# Patient Record
Sex: Male | Born: 1940 | Race: White | Hispanic: No | Marital: Married
Health system: Midwestern US, Academic
[De-identification: ages and names within clinical notes are randomized; demographics above are authoritative.]

---

## 2018-08-19 ENCOUNTER — Encounter: Admit: 2018-08-19 | Discharge: 2018-08-19 | Payer: MEDICARE

## 2018-08-22 ENCOUNTER — Encounter: Admit: 2018-08-22 | Discharge: 2018-08-22 | Payer: MEDICARE

## 2018-08-22 ENCOUNTER — Ambulatory Visit: Admit: 2018-08-22 | Discharge: 2018-08-23 | Payer: MEDICARE

## 2018-08-22 DIAGNOSIS — R0602 Shortness of breath: ICD-10-CM

## 2018-08-22 DIAGNOSIS — R634 Abnormal weight loss: ICD-10-CM

## 2018-08-22 DIAGNOSIS — R1310 Dysphagia, unspecified: Principal | ICD-10-CM

## 2018-08-22 DIAGNOSIS — R112 Nausea with vomiting, unspecified: ICD-10-CM

## 2018-08-22 DIAGNOSIS — R079 Chest pain, unspecified: ICD-10-CM

## 2018-08-28 ENCOUNTER — Encounter: Admit: 2018-08-28 | Discharge: 2018-08-28 | Payer: MEDICARE

## 2018-08-28 NOTE — Progress Notes
Alll biopsies were normal. No Hp.      During November 2019 hospitalization, he saw a cardiologist, had CT scan and EKG. CT of chest showed pleural effusion.  Echocardiogram showed ejection fraction of 55%.  Blood tests were unremarkable.  Culture for AFB, and work-up for syphilis were negative.  He has been on hydrocodone 5/325 mg every 4 hours as needed which he takes twice a week.  Colonoscopy in December 2019 reportedly was unremarkable.    Medications: He takes ASA, pepcid 20 mg daily. Pantoprazole 40 mg QD, tagament TID, flomax for BPH, lamictal for hx of seizure 20 years ago.     ROS: he has cough and phlegm clear. No F/C. bowel movement is every other day. He gets SOB if walks. He wakes up in the middle of night with SOB.     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 ETOH one beer a week. Lives with wife. Wife has 3 children and he has 2 children. His 3rd marriage, divorced.     FH: Brother had CAD, COPD and DM. Died at age 56.  F: died at age 100 yo, old age.  M: died at age 7 during childbirth.        No past medical history on file.  No past surgical history on file.  Social History     Tobacco Use   ??? Smoking status: Not on file   Substance Use Topics   ??? Alcohol use: Not on file   ??? Drug use: Not on file     No family history on file.  No Known Allergies  ??? ALPRAZolam (XANAX) 0.5 mg tablet Take 0.5 mg by mouth.   ??? aspirin EC 81 mg tablet Take 81 mg by mouth daily.   ??? calcium carbonate/vitamin D-3 (OSCAL-500+D) 1250 mg/200 unit tablet Take 1 tablet by mouth daily.   ??? famotidine (PEPCID) 20 mg tablet    ??? gabapentin (NEURONTIN) 300 mg capsule Take 300 mg by mouth.   ??? HYDROcodone/acetaminophen (NORCO) 5/325 mg tablet Take 1 tablet by mouth   ??? lamoTRIgine (LAMICTAL) 200 mg tablet Take 200 mg by mouth daily.   ??? ondansetron (ZOFRAN ODT) 4 mg rapid dissolve tablet DISSOLVE 1 TABLET IN MOUTH EVERY 6 HOURS AS NEEDED FOR NAUSEA AND VOMITING   ??? Pantoprazole 40 mg grps

## 2018-09-10 ENCOUNTER — Encounter: Admit: 2018-09-10 | Discharge: 2018-09-10 | Payer: MEDICARE

## 2018-09-10 DIAGNOSIS — R112 Nausea with vomiting, unspecified: ICD-10-CM

## 2018-09-10 DIAGNOSIS — R634 Abnormal weight loss: ICD-10-CM

## 2018-09-10 DIAGNOSIS — Z8701 Personal history of pneumonia (recurrent): ICD-10-CM

## 2018-09-10 DIAGNOSIS — Z8709 Personal history of other diseases of the respiratory system: ICD-10-CM

## 2018-09-10 DIAGNOSIS — R0602 Shortness of breath: ICD-10-CM

## 2018-09-10 DIAGNOSIS — R1310 Dysphagia, unspecified: Principal | ICD-10-CM

## 2018-09-10 DIAGNOSIS — R109 Unspecified abdominal pain: ICD-10-CM

## 2018-09-10 DIAGNOSIS — R079 Chest pain, unspecified: ICD-10-CM

## 2018-09-12 ENCOUNTER — Encounter: Admit: 2018-09-12 | Discharge: 2018-09-12 | Payer: MEDICARE

## 2018-09-12 NOTE — Telephone Encounter
Called. Was not available. and gave the nurse my cell # for Doctor Wyant to call.

## 2018-09-12 NOTE — Telephone Encounter
Reviewed 10 pages of OSH ER visit in 06/2018, CBC, CMP, CXR. A/P: symptomatic treatment for Nausea and pain.  CXR: pleural thickeninga nd blunting.   Blood etts significant for Crt 1.3, normal HB, MCV 95, Eosinophils 9.1%.    Please: CT scan of chest abdomen and pelvis, echocardiogram, swallow evaluation under fluoroscopy and esophagogram with 13 mm barium tablet.   Blood test for CBC, CMP, TSH, ESR, CRP, SPEP, Vitamin D, B12, iron studies, INR

## 2018-09-13 ENCOUNTER — Encounter: Admit: 2018-09-13 | Discharge: 2018-09-13 | Payer: MEDICARE

## 2018-09-13 NOTE — Telephone Encounter
See Mychart message dated 09/10/18 for further documentation.

## 2018-09-14 NOTE — Telephone Encounter
Physically printed all records   Called Dr. Garner Nash office at [phone]. Was informed they received the faxed. They ordered and scheduled 2D Echo, CT of chest/abdomen/pelvis with contrast, Esophagram w/ 13 mm barium tablet and swallow study with fluoroscopy to be done locally. Pt had blood work drawn today. Requested all results to be faxed to [phone]. Mychart message sent to Olegario Messier to update her on conversation as well as VM left for her with same info.

## 2018-09-28 ENCOUNTER — Encounter: Admit: 2018-09-28 | Discharge: 2018-09-28 | Payer: MEDICARE

## 2018-09-29 ENCOUNTER — Encounter: Admit: 2018-09-29 | Discharge: 2018-09-29 | Payer: MEDICARE

## 2018-09-30 ENCOUNTER — Encounter: Admit: 2018-09-30 | Discharge: 2018-09-30 | Payer: MEDICARE

## 2018-09-30 DIAGNOSIS — R634 Abnormal weight loss: ICD-10-CM

## 2018-09-30 DIAGNOSIS — R1310 Dysphagia, unspecified: Principal | ICD-10-CM

## 2018-09-30 DIAGNOSIS — R109 Unspecified abdominal pain: ICD-10-CM

## 2018-09-30 DIAGNOSIS — R112 Nausea with vomiting, unspecified: ICD-10-CM

## 2018-09-30 DIAGNOSIS — R079 Chest pain, unspecified: ICD-10-CM

## 2018-09-30 DIAGNOSIS — R0602 Shortness of breath: ICD-10-CM

## 2018-09-30 LAB — CBC
Lab: 15
Lab: 268
Lab: 33
Lab: 4.8
Lab: 6.3
Lab: 95 — ABNORMAL HIGH
Lab: 31
Lab: 45
Lab: 7.7 — ABNORMAL LOW

## 2018-09-30 LAB — COMPREHENSIVE METABOLIC PANEL
Lab: 10
Lab: 108 — ABNORMAL HIGH
Lab: 14 — ABNORMAL LOW
Lab: 18
Lab: 23
Lab: 27
Lab: 4.1
Lab: 7.3
Lab: 86
Lab: 9.6
Lab: 0.8
Lab: 1.2
Lab: 141
Lab: 4.3
Lab: 58 — ABNORMAL LOW
Lab: 96

## 2018-09-30 LAB — SED RATE: Lab: 3

## 2018-10-03 ENCOUNTER — Encounter: Admit: 2018-10-03 | Discharge: 2018-10-03

## 2018-10-03 DIAGNOSIS — R1310 Dysphagia, unspecified: Principal | ICD-10-CM

## 2018-10-03 DIAGNOSIS — K224 Dyskinesia of esophagus: Secondary | ICD-10-CM

## 2018-10-03 DIAGNOSIS — R079 Chest pain, unspecified: Secondary | ICD-10-CM

## 2018-10-03 DIAGNOSIS — Z1159 Encounter for screening for other viral diseases: Secondary | ICD-10-CM

## 2018-10-03 DIAGNOSIS — K222 Esophageal obstruction: Secondary | ICD-10-CM

## 2018-10-03 DIAGNOSIS — R634 Abnormal weight loss: Secondary | ICD-10-CM

## 2018-10-03 MED ORDER — SODIUM CHLORIDE 0.9 % IV SOLP
INTRAVENOUS | 0 refills | Status: CN
Start: 2018-10-03 — End: ?

## 2018-10-04 ENCOUNTER — Encounter: Admit: 2018-10-04 | Discharge: 2018-10-04

## 2018-10-04 DIAGNOSIS — Z8709 Personal history of other diseases of the respiratory system: Secondary | ICD-10-CM

## 2018-10-04 DIAGNOSIS — R1310 Dysphagia, unspecified: Secondary | ICD-10-CM

## 2018-10-04 DIAGNOSIS — Z8701 Personal history of pneumonia (recurrent): Secondary | ICD-10-CM

## 2018-10-04 DIAGNOSIS — R634 Abnormal weight loss: Secondary | ICD-10-CM

## 2018-10-04 DIAGNOSIS — R109 Unspecified abdominal pain: Secondary | ICD-10-CM

## 2018-10-04 DIAGNOSIS — R0602 Shortness of breath: Secondary | ICD-10-CM

## 2018-10-04 DIAGNOSIS — R112 Nausea with vomiting, unspecified: Secondary | ICD-10-CM

## 2018-10-04 DIAGNOSIS — R079 Chest pain, unspecified: Secondary | ICD-10-CM

## 2018-10-04 LAB — PROTIME INR (PT): Lab: 1.1 g/dL

## 2018-10-05 ENCOUNTER — Encounter: Admit: 2018-10-05 | Discharge: 2018-10-05

## 2018-10-05 DIAGNOSIS — R1310 Dysphagia, unspecified: Principal | ICD-10-CM

## 2018-10-05 NOTE — Telephone Encounter
Called PCP office. Results are not in yet for Covid-19 test. Informed to call [facility] at [phone]. Called hospital. Results are not in yet. Tech stated other tests that were sent at the same time have had results come back very recently. Requested the results to be sent ASAP. Was informed they will receive the results at the same time the office does via fax. Confirmed they have the correct fax number. Pt lives about 2 hrs from [place]. This RN will check early tomorrow for faxed results.    Called pt's daughter Olegario Messier. Informed her of the above. Will get a call from me in the morning by 0730 to let her know the results were received. Results MUST be received prior to procedure.  Olegario Messier  verbalized understanding. Had no further questions or concerns.    Routing to Dr. Burnis Medin as Lorain Childes.

## 2018-10-06 ENCOUNTER — Ambulatory Visit: Admit: 2018-10-05 | Discharge: 2018-10-05

## 2018-10-06 ENCOUNTER — Encounter: Admit: 2018-10-06 | Discharge: 2018-10-06

## 2018-10-06 ENCOUNTER — Ambulatory Visit: Admit: 2018-10-06 | Discharge: 2018-10-06

## 2018-10-06 ENCOUNTER — Ambulatory Visit: Admit: 2018-10-03 | Discharge: 2018-10-03

## 2018-10-06 ENCOUNTER — Ambulatory Visit: Admit: 2018-10-06 | Discharge: 2018-10-07

## 2018-10-06 DIAGNOSIS — K220 Achalasia of cardia: Secondary | ICD-10-CM

## 2018-10-06 DIAGNOSIS — Z79899 Other long term (current) drug therapy: Secondary | ICD-10-CM

## 2018-10-06 DIAGNOSIS — K219 Gastro-esophageal reflux disease without esophagitis: Secondary | ICD-10-CM

## 2018-10-06 DIAGNOSIS — R634 Abnormal weight loss: Secondary | ICD-10-CM

## 2018-10-06 DIAGNOSIS — Z8619 Personal history of other infectious and parasitic diseases: Secondary | ICD-10-CM

## 2018-10-06 DIAGNOSIS — G4700 Insomnia, unspecified: Secondary | ICD-10-CM

## 2018-10-06 DIAGNOSIS — Z7982 Long term (current) use of aspirin: Secondary | ICD-10-CM

## 2018-10-06 DIAGNOSIS — K2950 Unspecified chronic gastritis without bleeding: Secondary | ICD-10-CM

## 2018-10-06 DIAGNOSIS — R1310 Dysphagia, unspecified: Secondary | ICD-10-CM

## 2018-10-06 DIAGNOSIS — R221 Localized swelling, mass and lump, neck: Secondary | ICD-10-CM

## 2018-10-06 DIAGNOSIS — K929 Disease of digestive system, unspecified: Secondary | ICD-10-CM

## 2018-10-06 DIAGNOSIS — J3489 Other specified disorders of nose and nasal sinuses: Secondary | ICD-10-CM

## 2018-10-06 DIAGNOSIS — K759 Inflammatory liver disease, unspecified: Secondary | ICD-10-CM

## 2018-10-06 DIAGNOSIS — R079 Chest pain, unspecified: Secondary | ICD-10-CM

## 2018-10-06 DIAGNOSIS — R569 Unspecified convulsions: Secondary | ICD-10-CM

## 2018-10-06 DIAGNOSIS — Z1159 Encounter for screening for other viral diseases: Secondary | ICD-10-CM

## 2018-10-06 LAB — COVID-19 (SARS-COV-2) PCR

## 2018-10-06 MED ORDER — PHENYLEPHRINE IN 0.9% NACL(PF) 1 MG/10 ML (100 MCG/ML) IV SYRG
0 refills | Status: DC
Start: 2018-10-06 — End: 2018-10-06
  Administered 2018-10-06 (×2): 50 ug via INTRAVENOUS

## 2018-10-06 MED ORDER — SODIUM CHLORIDE 0.9 % IV SOLP
INTRAVENOUS | 0 refills | Status: CN
Start: 2018-10-06 — End: ?

## 2018-10-06 MED ORDER — LIDOCAINE (PF) 200 MG/10 ML (2 %) IJ SYRG
0 refills | Status: DC
Start: 2018-10-06 — End: 2018-10-06
  Administered 2018-10-06: 18:00:00 60 mg via INTRAVENOUS

## 2018-10-06 MED ORDER — LIDOCAINE HCL 2 % MM JELL
0 refills | Status: DC
Start: 2018-10-06 — End: 2018-10-06
  Administered 2018-10-06: 16:00:00 3 mL via TOPICAL

## 2018-10-06 MED ORDER — IOHEXOL 350 MG IODINE/ML IV SOLN
80 mL | Freq: Once | INTRAVENOUS | 0 refills | Status: CP
Start: 2018-10-06 — End: ?
  Administered 2018-10-06: 21:00:00 80 mL via INTRAVENOUS

## 2018-10-06 MED ORDER — LACTATED RINGERS IV SOLP
1000 mL | Freq: Once | INTRAVENOUS | 0 refills | Status: CP
Start: 2018-10-06 — End: ?
  Administered 2018-10-06: 18:00:00 1000.000 mL via INTRAVENOUS

## 2018-10-06 MED ORDER — PROPOFOL 10 MG/ML IV EMUL 20 ML (INFUSION)(AM)(OR)
INTRAVENOUS | 0 refills | Status: DC
Start: 2018-10-06 — End: 2018-10-06
  Administered 2018-10-06: 18:00:00 120 ug/kg/min via INTRAVENOUS

## 2018-10-06 MED ORDER — SODIUM CHLORIDE 0.9 % IJ SOLN
50 mL | Freq: Once | INTRAVENOUS | 0 refills | Status: CP
Start: 2018-10-06 — End: ?
  Administered 2018-10-06: 21:00:00 50 mL via INTRAVENOUS

## 2018-10-06 NOTE — Discharge Planning (AHS/AVS)
EGD/Upper EUS/ERCP/Antegrade Enteroscopy  Post Upper Endoscopy Instructions        -You may have a sore throat after the procedure for 2-3 days.  Try sucrets or lozenges to help ease the pain.  If it continues please contact us.    -If you feel feverish, have a temperature of 101 degrees or higher, persistent nausea and vomiting, abdominal pain or dark stools; please notify your nurse or GI physician.    -You may have abdominal cramping following the procedure this can be relieved by belching or passing air.    -If you have redness or swelling at the IV site, place a warm, wet washcloth over the affected areas for 15 minutes, 3-4 times a day until the redness subsides.  If symptoms continue for 2-3 days, contact your regular physician.    - If you have bleeding from your mouth, over 2 tablespoons and increasing, please notify your physician.  A small amount of bleeding is normal if a biopsy or polyps were taken.  If you are vomiting blood you need to seek immediate medical attention.    - You may resume all your routine medications, if medications need to be held your physician and/or nurse will notify you post procedure.    SPECIFIC INSTRUCTIONS    OUTPATIENTS:  A. Because of sedation and lack of coordination, FOR THE NEXT 24 HOURS, DO NOT:  1. Operate any motorized vehicle - this includes driving.  2. Sign any legal documents or conduct important business matters.  3. Use any dangerous machinery (chain saw, lawnmower, etc.).  4. Drink any alcoholic beverages.  Should you have any questions or concerns after your procedure please call [phone] M-F 8am-5:00 pm. After 5:00 pm, holidays or weekends call [phone] and ask for the GI Doctor on call.

## 2018-10-06 NOTE — Discharge Instructions
Diet after Procedure:    Please return to your previous diet as tolerated.

## 2018-10-06 NOTE — Telephone Encounter
Ordered procedure. Rayna Sexton, pt's daughter and reviewed indications for EUS and hoping to get it scheduled for next week (Monday). Per Dr. Milta Deiters, no need to repeat Covid test since pt tested on 6/2.  If no abnormalities, POEM is indicated and can be preformed on 10/24/18. Reviewed POEM, hospital stay length and expectations.  Olegario Messier verbalized understanding. Agreed to have EUS next week, and will look more in to POEM.  Had no further questions or concerns. Mychart message sent w/ EUS prep instructions.

## 2018-10-06 NOTE — Progress Notes
[facility]    Esophageal Manometry    10/06/2018    Evan Stevens  Attending Physician: Burnis Medin    Chief Complaint: Dysphagia, Chest Pain, Heartburn, Reflux, or Other? Dysphagia; chest pain    Medical History:  History reviewed. No pertinent past medical history.    Surgical History:  Surgical History:   Procedure Laterality Date    BACK SURGERY  1980       Medications:  No current facility-administered medications for this encounter.        Focused History:  Dysphagia:Yes Solids? Yes Liquids? Yes   Location: Back of throat? No Mid or lower chest? Yes   Frequency: Daily? Yes Weekly? No Other?   Does Temperature Matter? No    Odynophagia? No     Chest Pain? Yes   Severity: Scale 1-10:can get up to a 6   Type: Burning, Pressure, Tightness, Sharp, Dull, or Other:burning; tightness   Pain Factors: with meals    Heartburn: Yes   Duration (Months / Years): four months   Frequency (Daily, Weekly, Monthly): once a week    Eckardt Score: 11  Score Wt Loss (kg) Dysphagia Retrosternal Pain Regurgitation      0  None      None  None   None      1  <5  Occasional Occasional  Occasional      2  5-10  Daily  Daily   Daily      3  >10  Each Meal Each Meal  Each Meal    Gastroparesis: No    Chronic / Frequent Cough: Yes   Regurgitation of food: Yes   Nausea: Yes     Vomiting: Yes     Abdominal Pain: Yes    Weight Loss: Yes     Asthma / Wheezing: No    Diabetes: No     Heart Disease: No    Stroke: No      Cancer: No  Parkinson's: No  Raynaud's: No  Parotiditis: No  Mumps: Yes  Tonsillectomy: No  Broken Nose: No  Bitter / Acid taste at night: No  Barium Swallow: Yes  Motility Study: No  Endoscopy: Yes

## 2018-10-06 NOTE — Anesthesia Post-Procedure Evaluation
Post-Anesthesia Evaluation    Name: Evan Stevens      MRN: [id number]     DOB: [birth year]     Age: 78 y.o.     Sex: male   __________________________________________________________________________     Procedure Date: 10/06/2018  Procedure(s):  EGD w/ Endoflip  ESOPHAGEAL BALLOON DISTENSION STUDY WITH/ WITHOUT PROVOCATION - DIAGNOSTIC  ESOPHAGOGASTRODUODENOSCOPY WITH BIOPSY - FLEXIBLE      Surgeon: Surgeon(s):  Jolee Ewing, MD    Post-Anesthesia Vitals  BP: 101/61 (06/04 1415)  Temp: 36.4 C (97.5 F) (06/04 1408)  Pulse: 48 (06/04 1415)  Respirations: 16 PER MINUTE (06/04 1415)  SpO2: 98 % (06/04 1415)  SpO2 Pulse: 51 (06/04 1415)   Vitals Value Taken Time   BP 101/61 10/06/2018  2:15 PM   Temp     Pulse 48 10/06/2018  2:15 PM   Respirations 16 PER MINUTE 10/06/2018  2:15 PM   SpO2 98 % 10/06/2018  2:15 PM         Post Anesthesia Evaluation Note    Evaluation location: Pre/Post  Patient participation: recovered; patient participated in evaluation  Level of consciousness: alert    Pain score: 0  Pain management: adequate    Hydration: normovolemia  Temperature: 36.0C - 38.4C  Airway patency: adequate    Perioperative Events       Post-op nausea and vomiting: no PONV    Postoperative Status  Cardiovascular status: hemodynamically stable  Respiratory status: spontaneous ventilation        Perioperative Events  Perioperative Event: No  Emergency Case Activation: No

## 2018-10-06 NOTE — Telephone Encounter
Received VO from Dr. Burnis Medin to order CT of Neck ASAP. During visit, it was noted pt has mass of neck w/ C spine deviation, dysphagia. Order placed. [facility], can get to pt today but it will be a wait. Called Evan Stevens and is aware of mass of neck. Ok to have CT today after d/c from endoscopy. Informed Evan Stevens [facility]. Will call her w/ results after Dr. Burnis Medin reviewed.  Evan Stevens verbalized understanding. Had no further questions or concerns.

## 2018-10-06 NOTE — Progress Notes
Had egd with endoflip. Reviewed 2d echo, esophagogram, Ct chest, abd and pelvis done in outside facility.      Saline was infused into the bag to a volume of                  60 mL. The observed distensibility at 60 cc was                  less than 0.69 mm2/mmHg with a minimum diameter                  of 6.1 mm. FLIP Topography was performed using                  stepwise distensions and demonstrated                  diminished contractility. The catheter was                  deflated and withdrawn. 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   Recommendation:  1. ) CT of the neck with contrast (there is a 20 X                  10 cm oval shaped soft tissue in the back of                  the neck with deviation of cervical spine.                  There is left supraclavicular fullness on exam                  and left para-scapular soft tissue                  contractility).                   2. )  EUS of esophagus, to rule out mediastinal                  abnormality/pseudoachalasia and esophageal                  muscle thickness measurement.

## 2018-10-06 NOTE — Telephone Encounter
Progress Notes by Jolee Ewing, MD at 10/06/18 1432     Author: Jolee Ewing, MD Service: Gastroenterology Author Type: Physician   Filed: 10/06/18 1436 Creation Time: 10/06/18 1432 Note Type: Progress Notes   Status: Signed Editor: Jolee Ewing, MD (Physician)      Had egd with endoflip. Reviewed 2d echo, esophagogram, Ct chest, abd and pelvis done in outside facility.                                      Saline was infused into the bag to a volume of                  60 mL. The observed distensibility at 60 cc was                  less than 0.69 mm2/mmHg with a minimum diameter                  of 6.1 mm. FLIP Topography was performed using                  stepwise distensions and demonstrated                  diminished contractility. The catheter was                  deflated and withdrawn. 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   Recommendation:  1. ) CT of the neck with contrast (there is a 20 X                  10 cm oval shaped soft tissue in the back of                  the neck with deviation of cervical spine.                  There is left supraclavicular fullness on exam                  and left para-scapular soft tissue                  contractility).                   2. )  EUS of esophagus, to rule out mediastinal                  abnormality/pseudoachalasia and esophageal                  muscle thickness

## 2018-10-06 NOTE — H&P (View-Only)
Pre Procedure History and Physical/Sedation Plan    Date of Procedure:  10/06/2018    Planned Procedure(s):  EGd with endoflip  Sedation/Medication Plan: MAC (Monitored Anesthesia Care)  Discussion/Reviews:  Physician has discussed risks and alternatives of this type of sedation and above planned procedures with patient.  ___________________________________________________________________  Chief Complaint:  Dysphagia, HRM showed EGJ pressure is elevated at rest and doesn???t relax completely during swallows, but the esophageal contractions are peristaltic. 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     History of Present Illness: Evan Stevens is a 78 y.o. male with   Medical History:   Diagnosis Date   ??? Gastrointestinal disorder     GERD   ??? Hepatitis     HX OF LONG TIME AGO.   ??? Insomnia    ??? Seizures (HCC)        Surgical History:   Procedure Laterality Date   ??? BACK SURGERY  1980       No Known Allergies    History reviewed. No pertinent family history.    Previous Anesthetic/Sedation History:  No complications    Nursing Medical History     Nursing Surgical History     Social History     Tobacco Use   ??? Smoking status: Never Smoker   ??? Smokeless tobacco: Former Estate agent Use Topics   ??? Alcohol use: Yes   ??? Drug use: Never     Social History     Substance and Sexual Activity   Drug Use Never     Allergies:  Patient has no known allergies.  Medications  Medications Prior to Admission   Medication Sig   ??? ALPRAZolam (XANAX) 0.5 mg tablet Take 0.5 mg by mouth.   ??? aspirin EC 81 mg tablet Take 81 mg by mouth daily.   ??? calcium carbonate/vitamin D-3 (OSCAL-500+D) 1250 mg/200 unit tablet Take 1 tablet by mouth daily.   ??? famotidine (PEPCID) 20 mg tablet    ??? gabapentin (NEURONTIN) 300 mg capsule Take 300 mg by mouth.   ??? HYDROcodone/acetaminophen (NORCO) 5/325 mg tablet Take 1 tablet by mouth   ??? lamoTRIgine (LAMICTAL) 200 mg tablet Take 200 mg by mouth daily. ??? ondansetron (ZOFRAN ODT) 4 mg rapid dissolve tablet DISSOLVE 1 TABLET IN MOUTH EVERY 6 HOURS AS NEEDED FOR NAUSEA AND VOMITING   ??? Pantoprazole 40 mg grps    ??? raNITIdine (ZANTAC) 300 mg tablet Take 300 mg by mouth twice daily.   ??? sertraline (ZOLOFT) 50 mg tablet Take 50 mg by mouth daily.   ??? sucralfate (CARAFATE) 1 gram tablet Take 1 g by mouth three times daily before meals.   ??? tamsulosin (FLOMAX) 0.4 mg capsule Take 0.4 mg by mouth daily.   ??? zolpidem (AMBIEN) 5 mg tablet Take 5 mg by mouth daily as needed.     Review of Systems:  Comprehensive ROS was reviewed with patient and is positive for dysphagia.            Physical Exam:   Temp: 36.5 ???C (97.7 ???F) (06/04 1220)  Pulse: 49 (06/04 1220)  Respirations: 17 PER MINUTE (06/04 1220)  BP: 170/85 (06/04 1220)  GENERAL:   Alert and oriented x 3, not in acute distress, looking stated age, cooperative.  HEAD:  Normocephalic, atraumatic.  ENT:  Oropharynx is clear, no thrush or oral ulcer, no nasal discharge.  LUNGS: Symmetric expansion, clear to auscultation bilaterally, no crackles or wheezing.  HEART:  Regular rate and rhythm, no murmur or gallop.  ABDOMEN: Soft, non-tender, non-distended, no costovertebral angle tenderness, no rebound or guarding, no hepatosplenomegaly, no shifting dullness, bowel sounds are normoactive in all four quadrants.  EXTREMITIES:   No cyanosis, clubbing or edema.    Airway:  airway assessment performed  Mallampati I (soft palate, uvula, fauces, tonsillar pillars visible)  Anesthesia Classification:  ASA III (A patient with a severe systemic disease that limits activity, but is not incapacitating)    Lab/Radiology/Other Diagnostic Tests  Labs:    Hematology:    Lab Results   Component Value Date    HGB 15.1 09/14/2018    HCT 45.7 09/14/2018    PLTCT 268 09/14/2018    WBC 6.3 09/14/2018    RBC 4.80 09/14/2018    MCV 95.0 09/14/2018    MCH 31.5 09/14/2018    MCHC 33.0 09/14/2018    MPV 7.7 09/14/2018    RDW 13.8 09/14/2018 and Coagulation:    Lab Results   Component Value Date    INR 1.1 09/14/2018       ATTESTATION  I personally performed the key portions of the E/M visit, discussed case with fellow and concur with fellow documentation of history, physical exam, assessment, and treatment plan unless otherwise noted.        Jolee Ewing, MD

## 2018-10-07 ENCOUNTER — Encounter: Admit: 2018-10-07 | Discharge: 2018-10-07

## 2018-10-07 DIAGNOSIS — R221 Localized swelling, mass and lump, neck: Secondary | ICD-10-CM

## 2018-10-07 DIAGNOSIS — K929 Disease of digestive system, unspecified: Secondary | ICD-10-CM

## 2018-10-07 DIAGNOSIS — G4700 Insomnia, unspecified: Secondary | ICD-10-CM

## 2018-10-07 DIAGNOSIS — R634 Abnormal weight loss: Secondary | ICD-10-CM

## 2018-10-07 DIAGNOSIS — K759 Inflammatory liver disease, unspecified: Secondary | ICD-10-CM

## 2018-10-07 DIAGNOSIS — R1310 Dysphagia, unspecified: Secondary | ICD-10-CM

## 2018-10-07 DIAGNOSIS — K220 Achalasia of cardia: Secondary | ICD-10-CM

## 2018-10-07 DIAGNOSIS — R569 Unspecified convulsions: Secondary | ICD-10-CM

## 2018-10-07 NOTE — Progress Notes
Called to prescreen for his Urgent EUS- spoke to patient. Scheduled him on Monday, June 8 at 1540 to be in admissions at 1410. Prep reviewed over the phone until they where able to repeat back the instructions. Instructions also sent to his My-Chart at this time. Patient requested that his daughter also be called with instructions. Attempted to call his daughter, however, the only phone number listed states it is not taking calls at this time.    COVID 19 will NOT need to be repeated as directed by Dr. Milta Deiters.      EUS (ENDOSCOPIC ULTRASOUND) PREP    PATIENT NAME: Evan Stevens                 Dr. Bernita Buffy      You are scheduled for an Upper Endoscopy Ultrasound on  Monday, June 8th, 2020 at 3:40 PM.              Bonita Quin must arrive by: 2:10 PM and check in at ADMISSION at [facility], located at [address] [place], [place]., [address].      If you need to reschedule or if you have questions call [phone].   EUS (ENDOSCOPIC ULTRASOUND) PREP      An endoscopic ultrasound (EUS) is a test to look at the inside of your gastrointestinal (GI) tract. It is commonly used to look for cancers or growths in the esophagus, stomach, pancreas, liver, and rectum. It can help to stage cancer (see how advanced a cancer is). EUS may also be used to help diagnose certain diseases or to drain cysts or abscesses.    5 Days Prior:  1. Check with your prescribing physician for instructions about stopping your blood thinner.  Examples of blood thinners are Aleve, Aspirin. Coumadin, Eliquis, Ibuprofen, Naproxen, Plavix, and Xarelto.  2. Do not give yourself a Lovenox injection the morning of the test. Lovenox injections may be taken as usual through the day before your test.   3. Discuss diabetic medications and insulin with the prescribing physician.    The day Prior to your Exam: Start on a Clear Liquid Diet!    The Day Of Your Exam: 1. Do not eat or drink anything after midnight the night before your exam.  This includes GUM or CANDY.  a. Chewing tobacco must be stopped (6) hours before your scheduled procedure.  b. If you have an early morning test, take ONLY your essential morning medications (heart, blood pressure, seizure, etc.) with a small sip of water.   2. You will be sedated for the procedure. A responsible adult must drive you home (no Benedetto Goad, taxis or buses are allowed). If you do not have a driver we will be unable to do the test.  3. You will be here for (3-4) hours from arrival time.  4. You will not be able to return to work the same day if you have received sedation.   5. Please bring a list of your current medications and the dosages with you.     The Procedure:  ? You lie on your left side on an exam table.   ? You will be monitored and given oxygen.   ? Your throat may be numbed with a spray or gargle. An intravenous (IV) line will be put into a vein in your arm or hand. This line supplies fluids and medicines. To keep you comfortable during the test, you will be given a sedative medicine. This medicine  prevents discomfort and will make you sleepy.  ? During the test, the scope sends live video and ultrasound images from inside your body to nearby monitors. These are used to examine your GI tract. Specialized procedures, such as drainage, are done as needed.  ? The healthcare provider may discuss the results with you soon after the test. Biopsy results take about (10) business days.  ? After the procedure is done, you rest for a short time period.   ? An adult must drive you home.

## 2018-10-10 ENCOUNTER — Encounter: Admit: 2018-10-10 | Discharge: 2018-10-10

## 2018-10-10 DIAGNOSIS — R569 Unspecified convulsions: Secondary | ICD-10-CM

## 2018-10-10 DIAGNOSIS — K929 Disease of digestive system, unspecified: Secondary | ICD-10-CM

## 2018-10-10 DIAGNOSIS — K759 Inflammatory liver disease, unspecified: Secondary | ICD-10-CM

## 2018-10-10 DIAGNOSIS — G4700 Insomnia, unspecified: Secondary | ICD-10-CM

## 2018-10-10 MED ORDER — FENTANYL CITRATE (PF) 50 MCG/ML IJ SOLN
50 ug | INTRAVENOUS | 0 refills | Status: DC | PRN
Start: 2018-10-10 — End: 2018-10-11

## 2018-10-10 MED ORDER — METOCLOPRAMIDE HCL 5 MG/ML IJ SOLN
10 mg | Freq: Once | INTRAVENOUS | 0 refills | Status: DC | PRN
Start: 2018-10-10 — End: 2018-10-11

## 2018-10-10 MED ORDER — PROPOFOL INJ 10 MG/ML IV VIAL
0 refills | Status: DC
Start: 2018-10-10 — End: 2018-10-10
  Administered 2018-10-10: 23:00:00 70 mg via INTRAVENOUS
  Administered 2018-10-10: 30 mg via INTRAVENOUS
  Administered 2018-10-10: 23:00:00 70 mg via INTRAVENOUS
  Administered 2018-10-10: 30 mg via INTRAVENOUS

## 2018-10-10 MED ORDER — PROMETHAZINE 25 MG/ML IJ SOLN
6.25 mg | INTRAVENOUS | 0 refills | Status: DC | PRN
Start: 2018-10-10 — End: 2018-10-11

## 2018-10-10 MED ORDER — DIPHENHYDRAMINE HCL 50 MG/ML IJ SOLN
25 mg | Freq: Once | INTRAVENOUS | 0 refills | Status: DC | PRN
Start: 2018-10-10 — End: 2018-10-11

## 2018-10-10 MED ORDER — FENTANYL CITRATE (PF) 50 MCG/ML IJ SOLN
25 ug | INTRAVENOUS | 0 refills | Status: DC | PRN
Start: 2018-10-10 — End: 2018-10-11

## 2018-10-10 MED ORDER — LACTATED RINGERS IV SOLP
0 refills | Status: DC
Start: 2018-10-10 — End: 2018-10-10
  Administered 2018-10-10: 23:00:00 via INTRAVENOUS

## 2018-10-10 MED ORDER — LIDOCAINE (PF) 200 MG/10 ML (2 %) IJ SYRG
0 refills | Status: DC
Start: 2018-10-10 — End: 2018-10-10
  Administered 2018-10-10: 23:00:00 20 mg via INTRAVENOUS
  Administered 2018-10-10: 23:00:00 80 mg via INTRAVENOUS

## 2018-10-10 NOTE — Discharge Planning (AHS/AVS)
EGD/Upper EUS/ERCP/Antegrade Enteroscopy  Post Upper Endoscopy Instructions       -You may have a sore throat after the procedure for 2-3 days.  Try sucrets or lozenges to help ease the pain.  If it continues please contact us.    -If you feel feverish, have a temperature of 101 degrees or higher, persistent nausea and vomiting, abdominal pain or dark stools; please notify your nurse or GI physician.    -You may have abdominal cramping following the procedure this can be relieved by belching or passing air.    -If you have redness or swelling at the IV site, place a warm, wet washcloth over the affected areas for 15 minutes, 3-4 times a day until the redness subsides.  If symptoms continue for 2-3 days, contact your regular physician.    - If you have bleeding from your mouth, over 2 tablespoons and increasing, please notify your physician.  A small amount of bleeding is normal if a biopsy or polyps were taken.  If you are vomiting blood you need to seek immediate medical attention.    - You may resume all your routine medications, if medications need to be held your physician and/or nurse will notify you post procedure.    SPECIFIC INSTRUCTIONS  INPATIENTS:  Ask for help when you get up in your room, as you may still be drowsy from your sedation.    OUTPATIENTS:  A. Because of sedation and lack of coordination, FOR THE NEXT 24 HOURS, DO NOT:  1. Operate any motorized vehicle - this includes driving.  2. Sign any legal documents or conduct important business matters.  3. Use any dangerous machinery (chain saw, lawnmower, etc.).  4. Drink any alcoholic beverages.  Should you have any questions or concerns after your procedure please call [phone] M-F 8am-5:00 pm. After 5:00 pm, holidays or weekends call [phone] and ask for the GI Doctor on call.

## 2018-10-10 NOTE — H&P (View-Only)
Pre Procedure History and Physical/Sedation Plan   Date of Procedure: 10/10/2018   Planned Procedure(s): GI:  eus  Sedation/Medication Plan: MAC (Monitored Anesthesia Care)  Discussion/Reviews: Physician has discussed risks and alternatives of this type of sedation and above planned procedures with patient  ___________________________________________________________________  Chief Complaint: dysphagia   History of Present Illness: Mainor Hellmann is a 78 y.o. male with for eus egd   Previous Anesthetic/Sedation History: No complications   Nursing Medical History     Nursing Surgical History     Pertinent medical/surgical history reviewed  Pertinent family history reviewedine   Social History     Tobacco Use   ??? Smoking status: Never Smoker   ??? Smokeless tobacco: Former Estate agent Use Topics   ??? Alcohol use: Yes   ??? Drug use: Never     Social History     Substance and Sexual Activity   Drug Use Never     Allergies: Patient has no known allergies.  Medications  Medications Prior to Admission   Medication Sig   ??? ALPRAZolam (XANAX) 0.5 mg tablet Take 0.5 mg by mouth.   ??? aspirin EC 81 mg tablet Take 81 mg by mouth daily.   ??? calcium carbonate/vitamin D-3 (OSCAL-500+D) 1250 mg/200 unit tablet Take 1 tablet by mouth daily.   ??? famotidine (PEPCID) 20 mg tablet    ??? gabapentin (NEURONTIN) 300 mg capsule Take 300 mg by mouth.   ??? HYDROcodone/acetaminophen (NORCO) 5/325 mg tablet Take 1 tablet by mouth   ??? lamoTRIgine (LAMICTAL) 200 mg tablet Take 200 mg by mouth daily.   ??? ondansetron (ZOFRAN ODT) 4 mg rapid dissolve tablet DISSOLVE 1 TABLET IN MOUTH EVERY 6 HOURS AS NEEDED FOR NAUSEA AND VOMITING   ??? Pantoprazole 40 mg grps    ??? raNITIdine (ZANTAC) 300 mg tablet Take 300 mg by mouth twice daily.   ??? sertraline (ZOLOFT) 50 mg tablet Take 50 mg by mouth daily.   ??? sucralfate (CARAFATE) 1 gram tablet Take 1 g by mouth three times daily before meals.   ??? tamsulosin (FLOMAX) 0.4 mg capsule Take 0.4 mg by mouth daily. ??? zolpidem (AMBIEN) 5 mg tablet Take 5 mg by mouth daily as needed.     Review of Systems:  All other systems reviewed and are negative.    Physical Exam:  @KUVITALS2 @  General appearance: alert  Throat: Lips, mucosa, and tongue normal. Teeth and gums normal  Lungs: clear to auscultation bilaterally  Heart: regular rate and rhythm, S1, S2 normal, no murmur, click, rub or gallop  Abdomen: soft, non-tender. Bowel sounds normal. No masses,  no organomegaly  Extremities: extremities normal, atraumatic, no cyanosis or edema   Airway: airway assessment performed  Mallampati III (soft palate, base of uvula visible)  Anesthesia Classification: ASA III (A patient with a severe systemic disease that limits activity, but is not incapacitating)   Lab/Radiology/Other Diagnostic Tests  Labs: Relevant labs reviewed   ATTESTATION  I personally performed the key portions of the E/M visit, discussed case with fellow and concur with fellow documentation of history, physical exam, assessment, and treatment plan unless otherwise noted.   Kyung Rudd, MD

## 2018-10-10 NOTE — Progress Notes
Await EUS result.

## 2018-10-11 ENCOUNTER — Ambulatory Visit: Admit: 2018-10-10 | Discharge: 2018-10-10

## 2018-10-11 ENCOUNTER — Encounter: Admit: 2018-10-11 | Discharge: 2018-10-11

## 2018-10-11 ENCOUNTER — Ambulatory Visit: Admit: 2018-10-10 | Discharge: 2018-10-11

## 2018-10-11 DIAGNOSIS — R1310 Dysphagia, unspecified: Principal | ICD-10-CM

## 2018-10-11 DIAGNOSIS — K759 Inflammatory liver disease, unspecified: Secondary | ICD-10-CM

## 2018-10-11 DIAGNOSIS — K929 Disease of digestive system, unspecified: Secondary | ICD-10-CM

## 2018-10-11 DIAGNOSIS — G4700 Insomnia, unspecified: Secondary | ICD-10-CM

## 2018-10-11 DIAGNOSIS — Z7982 Long term (current) use of aspirin: Secondary | ICD-10-CM

## 2018-10-11 DIAGNOSIS — R569 Unspecified convulsions: Secondary | ICD-10-CM

## 2018-10-11 NOTE — Anesthesia Post-Procedure Evaluation
Post-Anesthesia Evaluation    Name: Evan Stevens      MRN: [id number]     DOB: 23-Nov-1940     Age: 78 y.o.     Sex: male   __________________________________________________________________________     Procedure Date: 10/10/2018  Procedure(s):  ESOPHAGOGASTRODUODENOSCOPY WITH ENDOSCOPIC ULTRASOUND EXAMINATION - FLEXIBLE      Surgeon: Surgeon(s):  Gordy Savers, MD  Patrecia Pour, MD    Post-Anesthesia Vitals  BP: 146/64 (06/08 1900)  Temp: 36.8 C (98.2 F) (06/08 1900)  Pulse: 57 (06/08 1900)  Respirations: 16 PER MINUTE (06/08 1900)  SpO2: 97 % (06/08 1900)  SpO2 Pulse: 58 (06/08 1900)     Vitals Value Taken Time   BP 146/64 10/10/2018  7:00 PM   Temp 36.8 C (98.2 F) 10/10/2018  7:00 PM   Pulse 57 10/10/2018  7:00 PM   Respirations 16 PER MINUTE 10/10/2018  7:00 PM   SpO2 97 % 10/10/2018  7:00 PM       Post Anesthesia Evaluation Note    Evaluation location: Pre/Post  Patient participation: recovered; patient participated in evaluation  Level of consciousness: alert    Pain score: 0  Pain management: adequate    Hydration: normovolemia  Temperature: 36.0C - 38.4C  Airway patency: adequate    Perioperative Events       Post-op nausea and vomiting: no PONV    Postoperative Status  Cardiovascular status: hemodynamically stable  Respiratory status: spontaneous ventilation        Perioperative Events  Perioperative Event: No  Emergency Case Activation: No

## 2018-10-12 ENCOUNTER — Encounter: Admit: 2018-10-12 | Discharge: 2018-10-12

## 2018-10-12 DIAGNOSIS — K220 Achalasia of cardia: Secondary | ICD-10-CM

## 2018-10-12 DIAGNOSIS — R634 Abnormal weight loss: Secondary | ICD-10-CM

## 2018-10-12 DIAGNOSIS — R1310 Dysphagia, unspecified: Secondary | ICD-10-CM

## 2018-10-12 DIAGNOSIS — R937 Abnormal findings on diagnostic imaging of other parts of musculoskeletal system: Secondary | ICD-10-CM

## 2018-10-12 DIAGNOSIS — R9389 Abnormal findings on diagnostic imaging of other specified body structures: Secondary | ICD-10-CM

## 2018-10-12 NOTE — Telephone Encounter
-----   Message from Lillia Mountain, RN sent at 10/12/2018  9:39 AM CDT -----  Yes, absolutely. I will take care of this.   Dr. Prince Rome- just to clarify, you do not need to see him as well, correct?  Merleen Nicely  ----- Message -----  From: Juliane Poot, MD  Sent: 10/12/2018   9:51 AM CDT  To: Hermelinda Medicus, RN, Leveda Anna, RN, #    This would actually best be served by an orthopedic oncologist like Dr Aubery Lapping.   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 Can we get an MRI neck w/o and w/ contrast with consult with Dr Aubery Lapping afterward please?  Thank you much  Suezanne Jacquet  ----- Message -----  From: Isaiah Blakes, MD  Sent: 10/11/2018   8:84 PM CDT  To: Hermelinda Medicus, RN, Juliane Poot, MD    Please schedule in patient OV to discuss POEM. Send him info. Thx  Pleas refer him to Dr. 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

## 2018-10-12 NOTE — Telephone Encounter
Attempted to call Evan Stevens. LVM to return call and review results of CT of neck and to arrange an OV. Will attempt to call again at a later time.

## 2018-10-12 NOTE — Progress Notes
Please schedule in patient OV to discuss POEM. Send him info. Thx  Pleas refer him to Dr. 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

## 2018-10-12 NOTE — Telephone Encounter
Called [place]. Informed her CT of neck is abnormal and Dr. Caesar Chestnut recommends pt to be seen by oncology but oncology recommends pt to be seen by orthopedic oncology surgery first (see below). Must first check if finding is benign or malignant which is why we are referring to specialists to r/o.  Evan Stevens verbalized understanding. Their office will call her to arrange an appt. Evan Stevens agreed to OV w/ Dr. Caesar Chestnut on 10/14/18 at 2:40 under the condition pt sees orthopedic oncology surgery prior to the appt. Provided address/location info.  Evan Stevens verbalized understanding. Had no further questions or concerns.    Spoke to nurse navigator and was informed to place referral for Dr. Christoper Fabian who sees pts on fridays.    Routing to Dr. Caesar Chestnut as Evan Stevens.

## 2018-10-13 ENCOUNTER — Encounter: Admit: 2018-10-13 | Discharge: 2018-10-13

## 2018-10-14 ENCOUNTER — Encounter: Admit: 2018-10-14 | Discharge: 2018-10-14

## 2018-10-14 ENCOUNTER — Ambulatory Visit: Admit: 2018-10-14 | Discharge: 2018-10-14

## 2018-10-14 DIAGNOSIS — R569 Unspecified convulsions: Secondary | ICD-10-CM

## 2018-10-14 DIAGNOSIS — K929 Disease of digestive system, unspecified: Secondary | ICD-10-CM

## 2018-10-14 DIAGNOSIS — R634 Abnormal weight loss: Secondary | ICD-10-CM

## 2018-10-14 DIAGNOSIS — K759 Inflammatory liver disease, unspecified: Secondary | ICD-10-CM

## 2018-10-14 DIAGNOSIS — R937 Abnormal findings on diagnostic imaging of other parts of musculoskeletal system: Secondary | ICD-10-CM

## 2018-10-14 DIAGNOSIS — K220 Achalasia of cardia: Secondary | ICD-10-CM

## 2018-10-14 DIAGNOSIS — R1310 Dysphagia, unspecified: Secondary | ICD-10-CM

## 2018-10-14 DIAGNOSIS — G4700 Insomnia, unspecified: Secondary | ICD-10-CM

## 2018-10-14 DIAGNOSIS — M7989 Other specified soft tissue disorders: Secondary | ICD-10-CM

## 2018-10-14 DIAGNOSIS — R9389 Abnormal findings on diagnostic imaging of other specified body structures: Secondary | ICD-10-CM

## 2018-10-14 DIAGNOSIS — R222 Localized swelling, mass and lump, trunk: Secondary | ICD-10-CM

## 2018-10-14 NOTE — Progress Notes
Orthopaedic Surgery History and Physical - Jannette Fogo, MD    Referring Provider: Jolee Ewing    Date of Visit: 10/14/18  _______________________________________________    DIAGNOSIS:   Soft tissue mass, upper back/neck. Likely lipoma versus atypical lipoma.      PLAN:   - Will obtain an MRI scan w/wo contrast  - Will call the patient with the results  - If findings are consistent with lipoma or atypical lipoma further management is not required.  Patient does not with to pursue excision if it is not needed.   - Activity restrictions: No restrictions  - Patient may proceed with management as needed via gastroenterology.    ASSESSMENT:   The differential at this point includes lipoma, atypical lipoma, or far less likely - liposarcoma. Recommend obtaining an MRI scan to assist in confirming diagnosis.  If findings are consistent with a benign lesion this does not necessarily need to be removed.  Patient does not wish to pursue surgical resection if not required.  He may proceed with management as needed from a gastroenterology standpoint.         ________________________________________________    CHIEF COMPLAINT: Back Mass    HISTORY OF PRESENT ILLNESS: Evan Stevens is a 78 y.o. male who presents with a mass on the back.  The mass was first noticed 40 years ago.  He underwent a resection 35 years ago but noticed recurrence following this procedure.  It is not painful.  Since first noticing the mass, the patient reports that it has grown larger.  There have notbeen changes to the overlying skin.  The patient denies fevers, chills, or unexpected weight loss.  Workup to this point has included CT scan of the neck.  Patient was referred by gastroenterology.    REVIEW OF SYSTEMS:   12 systems (General, HENT, Eyes, Respiratory, CV, GI, GU, MS, Skin, Neurological, Hematologic, and Psychiatric) were reviewed and documented per the patient intake form completed during today's visit. The review was negative for reported symptoms except those detailed in HPI as well as the following: None    PAST MEDICAL HISTORY:   Medical History:   Diagnosis Date   ??? Gastrointestinal disorder     GERD   ??? Hepatitis     HX OF LONG TIME AGO.   ??? Insomnia    ??? Seizures (HCC)        PAST SURGICAL HISTORY:   Surgical History:   Procedure Laterality Date   ??? BACK SURGERY  1980   ??? EGD w/ Endoflip N/A 10/06/2018    Performed by Jolee Ewing, MD at [facility] ENDO   ??? ESOPHAGEAL BALLOON DISTENSION STUDY WITH/ WITHOUT PROVOCATION - DIAGNOSTIC N/A 10/06/2018    Performed by Jolee Ewing, MD at [facility] ENDO   ??? ESOPHAGOGASTRODUODENOSCOPY WITH BIOPSY - FLEXIBLE N/A 10/06/2018    Performed by Jolee Ewing, MD at [facility] ENDO   ??? ESOPHAGEAL MOTILITY STUDY N/A 10/06/2018    Performed by Jolee Ewing, MD at [facility] ENDO   ??? ESOPHAGOGASTRODUODENOSCOPY WITH ENDOSCOPIC ULTRASOUND EXAMINATION - FLEXIBLE N/A 10/10/2018    Performed by Comer Locket, MD at [facility] ENDO       FAMILY HISTORY: History reviewed. No pertinent family history.    SOCIAL HISTORY:  reports that he has never smoked. He has quit using smokeless tobacco. He reports current alcohol use. He reports that he does not use drugs.    MEDICATIONS:   Current Outpatient Medications:   ???  ALPRAZolam (XANAX) 0.5 mg tablet,  Take 0.5 mg by mouth., Disp: , Rfl:   ???  aspirin EC 81 mg tablet, Take 81 mg by mouth daily., Disp: , Rfl:   ???  calcium carbonate/vitamin D-3 (OSCAL-500+D) 1250 mg/200 unit tablet, Take 1 tablet by mouth daily., Disp: , Rfl:   ???  famotidine (PEPCID) 20 mg tablet, , Disp: , Rfl:   ???  gabapentin (NEURONTIN) 300 mg capsule, Take 300 mg by mouth., Disp: , Rfl:   ???  HYDROcodone/acetaminophen (NORCO) 5/325 mg tablet, Take 1 tablet by mouth, Disp: , Rfl:   ???  lamoTRIgine (LAMICTAL) 200 mg tablet, Take 200 mg by mouth daily., Disp: , Rfl:   ???  ondansetron (ZOFRAN ODT) 4 mg rapid dissolve tablet, DISSOLVE 1 TABLET IN MOUTH EVERY 6 HOURS AS NEEDED FOR NAUSEA AND VOMITING, Disp: , Rfl: ???  Pantoprazole 40 mg grps, , Disp: , Rfl:   ???  raNITIdine (ZANTAC) 300 mg tablet, Take 300 mg by mouth twice daily., Disp: , Rfl:   ???  sertraline (ZOLOFT) 50 mg tablet, Take 50 mg by mouth daily., Disp: , Rfl:   ???  sucralfate (CARAFATE) 1 gram tablet, Take 1 g by mouth three times daily before meals., Disp: , Rfl:   ???  tamsulosin (FLOMAX) 0.4 mg capsule, Take 0.4 mg by mouth daily., Disp: , Rfl:   ???  zolpidem (AMBIEN) 5 mg tablet, Take 5 mg by mouth daily as needed., Disp: , Rfl:     ALLERGIES: No Known Allergies    PHYSICAL EXAM:  Vitals:    10/14/18 1138   BP: 106/55   Pulse: 64   Resp: 16   Temp: 36.4 ???C (97.6 ???F)   SpO2: 97%       Musculoskeletal:    - Palpation: 10x5cm soft mass present at the junction of the upper back/neck consistent with a    - Sensation: normal sensation overlying lesion   - Overlying skin is intact without ulceration.       Constitutional: In no distress  Eyes: No scleral icterus, conjugate gaze intact  Respiratory: Non labored respirations, no audible wheezing, no coughing  Cardiovascular: Normal rate per peripheral pulse, normal rhythm per peripheral pulse  Lymphatic: No peripheral edema. No asymmetric edema.  Skin: No rashes. No ulcerations  Neurological: Normal sensation to light touch distal to mass.  No detectable weakness  Psychiatric: Mood is appropriate.  Judgement and insight seems appropriate.    IMAGING: Imaging and radiology reports reviewed by physician  CT scan reviewed:  9x7x4.5 cm subcutaneous homogenous mass consistent with an atypical lipoma versus far less likely liposarcoma

## 2018-10-14 NOTE — Progress Notes
Telehealth Visit Note    Date of Service: 10/14/2018    Subjective:      Obtained patient's verbal consent to treat them and their agreement to [facility] financial policy and NPP via this telehealth visit during the [facility] Emergency       Evan Stevens is a 78 y.o. male.    History of Present Illness  Evan Stevens is a delightful 78 y.o. WM, accompanied by his daughter, with PMH of reflux, discectomy in 15s and who is here for achalasia.      I saw him initially in April 2020 for evaluation of dysphagia to solids and liquids, 20 pounds weight loss in 1 year, and intermittent nausea and vomiting/regurgitation.  He was hospitalized in November 2019 locally for pneumonia and empyema with chest tube placement, and continues to have shortness of breath on exertion.   PMH was significant for .  Outside EGD March 2019 showing hiatal hernia, low-grade obstructive Schatzki ring dilated, with minimal response.  He underwent 2 EGDs (April 2019, March 2020, dilated up to 20 mm dilator), 2 ER visit in February 2020 for chest pain with no significant finding.  He underwent esophageal manometry March 2020 in outside hospital which reportedly was normal.    Swallow evaluation under fluroscopy locally showed no aspiration or penetration, but noted narrowing of esophagus and delay in bolus transit.   CT A/P with iv contrast locally 09/14/2018 was unremarkable.   CT of the chest 10/15/2018 showed no acute finding in the chest.  No malignancy.  Small scab.  Areas of pneumonitis in the right upper lobe, similar to August 2019.  Work-up at :   high-resolution manometry on 10/06/2018 showed elevated resting pressure, with incomplete relaxation of esophagogastric junction, suspicious for outflow obstruction versus incompletely expressed achalasia (Mean LES pressure of 48.3, residual pressure of 16.6, normal UES resting pressure and relaxation, peristaltic Esophageal contractions, except 4 weak swallows). 10/10/2018 EGD with Endoflip: Esophageal biopsies were negative for EoE, rare intraepithelial eosinophils (up to 3/HPF distally and 6/HPF, proxinmally).  dodenal and gastric biopsies were unremarkable.   Endoflip showed distensibility of 0.69 mm2/mmHg with a minimum diameter of 6.1 at 60 cc balloon distention.  Topography showed diminished contractility in esophagus, suggestive of achalasia.    EUS of esophagus 10/10/2018 was suggestive of achalasia and showed The inner circular layer was measured at 3.6 mm, and longitudinal muscle layer measured at 1.8 mm. The Muscle layer was measured at 6.2 mm. The Inner circular layer extended towards the lower esophagus, and could easily be followed upto 40 cm.      CT scan of neck with contrast 10/06/2018 for a large cervical mass showed 9.2 cm fat attenuation mass in the in the subcutaneous fat overlying the posterior paraspinous musculature without deep extension. The findings are consistent with a lipoma. There are some internal septations and linear densities. An atypical lipoma or well-differentiated liposarcoma can appear identical.2. Mild inflammatory as described paranasal sinus mucosal thickening and fluid. He saw Dr. Opal Sidles (surgeon) who recommended MRI of the mass.     Blood tests: CBC, CMP, ESR, INR are unremarkable.       Review of Systems   Constitutional: Positive for activity change, appetite change, fatigue and unexpected weight change.   HENT: Positive for drooling, trouble swallowing and voice change.    Eyes: Negative.    Respiratory: Positive for cough, choking and shortness of breath.    Cardiovascular: Positive for chest pain.   Gastrointestinal: Positive for abdominal  pain and vomiting.   Endocrine: Positive for polyphagia.   Genitourinary: Negative.    Musculoskeletal: Negative.    Skin: Negative.    Allergic/Immunologic: Negative.    Neurological: Positive for dizziness, tremors, seizures and weakness.   Hematological: Negative. Psychiatric/Behavioral: The patient is nervous/anxious.    All other systems reviewed and are negative.        Objective:         ??? ALPRAZolam (XANAX) 0.5 mg tablet Take 0.5 mg by mouth.   ??? aspirin EC 81 mg tablet Take 81 mg by mouth daily.   ??? calcium carbonate/vitamin D-3 (OSCAL-500+D) 1250 mg/200 unit tablet Take 1 tablet by mouth daily.   ??? famotidine (PEPCID) 20 mg tablet    ??? gabapentin (NEURONTIN) 300 mg capsule Take 300 mg by mouth.   ??? HYDROcodone/acetaminophen (NORCO) 5/325 mg tablet Take 1 tablet by mouth   ??? lamoTRIgine (LAMICTAL) 200 mg tablet Take 200 mg by mouth daily.   ??? ondansetron (ZOFRAN ODT) 4 mg rapid dissolve tablet DISSOLVE 1 TABLET IN MOUTH EVERY 6 HOURS AS NEEDED FOR NAUSEA AND VOMITING   ??? Pantoprazole 40 mg grps    ??? raNITIdine (ZANTAC) 300 mg tablet Take 300 mg by mouth twice daily.   ??? sertraline (ZOLOFT) 50 mg tablet Take 50 mg by mouth daily.   ??? sucralfate (CARAFATE) 1 gram tablet Take 1 g by mouth three times daily before meals.   ??? tamsulosin (FLOMAX) 0.4 mg capsule Take 0.4 mg by mouth daily.   ??? zolpidem (AMBIEN) 5 mg tablet Take 5 mg by mouth daily as needed.     Vitals:    10/14/18 1439   BP: (!) 117/95   BP Source: Arm, Left Upper   Patient Position: Sitting   Pulse: 54   Resp: 14   Temp: 36.6 ???C (97.9 ???F)   TempSrc: Oral   SpO2: 97%   Weight: 65.2 kg (143 lb 11.2 oz)   Height: 180.3 cm (70.98)   PainSc: Zero     Body mass index is 20.05 kg/m???.     Physical Exam         Assessment and Plan:       1. Achalasia; dysphagia to solid and liquid, nausea, and vomiting/regurgitation.  2. Hx of  Aspiration pneumonia with empyema and chest tube placement in Nov 2019  3. 20 lbs weight loss  4.  Atypical lipoma, or well-differentiated liposarcoma of the neck.       78 year old gentleman with more than 1 year history of solid food and liquid dysphagia, who had 2 EGDs in April 2019, and March 2020.  He was dilated up to 20 mm balloon in March 2020 without improvement.  He has had 2 emergency room visit in February 2020 for chest pain.  He had a long hospitalization in November 2019 for pneumonia, empyema, and chest tube placement, likely due to aspiration.  He had cardiac and pulmonary evaluations during that hospitalization (including troponin levels, echocardiogram, pulmonary function tests).    Evaluation at [facility] showed high LES pressure, with incomplete relaxation, IRP is 16.6.  Endoflip showed lack of distensibility of the LES (DI 0.69 with diameter of 6.1 cc with 60 cc balloon distention).  EGD showed no evidence of pseudo-achalasia or EOE.  EUS showed hypertrophy of the lower esophageal muscle and no mass lesion.    Today, we had discussion about achalasia; pathophysiology, clinical manifestations and management in details.  The discussion was conducted over 40 minutes, including drawing  diagrams, and reviewed options via Internet/youtube.  Different treatment options include Botox injection, pneumatic dilation, Heller myotomy, as well as peroral endoscopic myotomy.  The pros and cons of each approach was discussed in detail.  We specifically discussed in detail regarding per oral endoscopic myotomy.  Complications include but not limited to chest pain, infection, bleeding, perforation, and the need for emergency surgery, etc ...  We discussed about main differences between [facility] myotomy versus peroral endoscopic myotomy.  We discussed the details that he will have 30 to 40% chance of gastroesophageal symptoms after perioral endoscopic myotomy and would require to take proton pump inhibitor lifelong for prevention of acid reflux disease.  If he elects to undergo surgery for [facility] myotomy, we will refer him to 1 of our thoracic surgeons.   He has filled Eckard scoring questionnaire today.   He wishes to undergo POEM procedure (and his daughter agrees), therefore we will proceed with applying for preauthorization from his insurance carrier.  Once this is approved, he will be scheduled for the procedure.  Tentative procedure date is mid July 2020.  He will be admitted for at least 3 to 4 days for the peroral endoscopic myotomy.  POEM information packet was provided for them to review and call us with any questions or concerns.    We also await evaluation and management plan by Dr. Opal Sidles for neck mass.  If surgical approach is required, be postponed point procedure after the surgery.  He also needs to complete time esophagogram prior to POEM.        I explained the diagnosis and management plan in detail. No barrier to education was noted. he understood me well and repeated his understanding. I answered all his questions and concerns.   Total time spent with the patient face to face: 40 min. More than half of the time was spent on counseling the patient, see the above.   Jolee Ewing, MD     This note was in part completed with Dragon, a voice to text dictation system. Some errors may have occured and persist despite my best efforts to edit this document to eliminate dictation/translation???related errors.??????If you have questions/concerns, please contact me for clarification

## 2018-10-14 NOTE — Progress Notes
faxed order and demo to [facility] in [place] fax [phone], fax confirmation rec'd

## 2018-10-14 NOTE — Patient Instructions
We discussed POEM and handout given.    We will try to schedule on 7/15 for POEM if MRI is ok. This is not official and our office will call you once we get the date confirmed.    Work up will depend on MRI results.    We are transitioning to new billing practices; if you have any questions about your bill please call Patient Financial Services phone line: [phone] or [phone], Monday-Friday, 8:30a.m. - 4:30 p.m.      Please send Mychart message or call Elmo Putt, RN Dr. Angeline Slim nurse at [phone] if you have any questions or concerns.    General Instructions:   To have a medication refilled:  Please use the MyChart Refill request or contact your pharmacy directly to request medication refills.  Please allow 72 hours.    [facility] Lab is on the 1st floor. It is open from 7 am-6pm  Tuesday-Friday and 6:30am-7pm on Mondays, and 7 am - Noon on Saturdays    [facility] [facility] is located on the 2nd floor and is open 8 am-5 pm Monday-Friday   [place] location lab is located next to the check out desk and is open from 8 AM to  4:45 PM Monday through Friday.    Radiology is on the 2nd floor of the [place] and the 2nd [place]. Radiology Scheduling can be reached at [phone]   To Schedule office visits:  Call [phone] select option 1.     For procedure scheduling questions at the [facility] or [place] please call [phone]; for a procedure at [place] please call [phone].   To receive appointment reminders on your cell phone: Make sure we have your cell phone number, and Text [place] to [phone].   Support for many chronic illnesses is available through [employer]: [employer] or 305-469-9778.   For urgent questions on nights, weekends or holidays, call the Operator at [phone], and ask for the doctor on call for Gastroenterology. Call 911 for any emergencies.

## 2018-10-19 ENCOUNTER — Encounter: Admit: 2018-10-19 | Discharge: 2018-10-19

## 2018-10-19 NOTE — Progress Notes
MRI C-Spine appt 10/21/2018 2pm checkin, 2:20pm scan time, no prep    @ [facility], [place],   Ph: [phone]  Faxed order to: [phone]    Have pt checkin @ [facility], [address]. [place], [place] [address]    No prior auth needed (Medicare, [place])    Called pt's dtr Juliann Pulse to state appt date/time.    Patient cancelled MRI C-Spine @ [facility] for 10/18/2018.

## 2018-10-21 ENCOUNTER — Encounter: Admit: 2018-10-21 | Discharge: 2018-10-22

## 2018-10-23 ENCOUNTER — Encounter: Admit: 2018-10-23 | Discharge: 2018-10-23

## 2018-10-23 DIAGNOSIS — R569 Unspecified convulsions: Secondary | ICD-10-CM

## 2018-10-23 DIAGNOSIS — K929 Disease of digestive system, unspecified: Secondary | ICD-10-CM

## 2018-10-23 DIAGNOSIS — G4700 Insomnia, unspecified: Secondary | ICD-10-CM

## 2018-10-23 DIAGNOSIS — K759 Inflammatory liver disease, unspecified: Secondary | ICD-10-CM

## 2018-10-24 NOTE — Telephone Encounter
[place], [place]  Ph: [phone]. Was informed MRI is still pending. Has not been interpreted. Was informed to at a later time to check if its been completed.

## 2018-10-25 ENCOUNTER — Encounter: Admit: 2018-10-25 | Discharge: 2018-10-25

## 2018-10-27 NOTE — Telephone Encounter
[place],   Ph: [phone]. Spoke to medical records. Was informed MRI is still not read yet. Was informed to call radiology directly to inquire the status. Called radiology at [phone]. Was informed MRI still not read yet. This RN requested MRI to be ready urgently due to pending surgery. Was informed status has been elevated. Notified Dr. Caesar Chestnut

## 2018-10-28 NOTE — Telephone Encounter
[facility], [place], [place] radiology at [phone]. Was informed MRI is still not read. This RN requested MRI to be ready urgently due to pending surgery. Was informed they will ask radiology to read today. Will await results.

## 2018-10-31 ENCOUNTER — Encounter: Admit: 2018-10-31 | Discharge: 2018-10-31

## 2018-10-31 DIAGNOSIS — M7989 Other specified soft tissue disorders: Secondary | ICD-10-CM

## 2018-11-01 ENCOUNTER — Encounter: Admit: 2018-11-01 | Discharge: 2018-11-01

## 2018-11-01 DIAGNOSIS — Z1159 Encounter for screening for other viral diseases: Secondary | ICD-10-CM

## 2018-11-01 DIAGNOSIS — R1310 Dysphagia, unspecified: Secondary | ICD-10-CM

## 2018-11-01 DIAGNOSIS — K220 Achalasia of cardia: Secondary | ICD-10-CM

## 2018-11-01 DIAGNOSIS — K224 Dyskinesia of esophagus: Secondary | ICD-10-CM

## 2018-11-01 MED ORDER — SODIUM CHLORIDE 0.9 % IV SOLP
INTRAVENOUS | 0 refills | Status: CN
Start: 2018-11-01 — End: ?

## 2018-11-01 NOTE — Telephone Encounter
MRI result received by Dr. Violeta Gelinas office. Per note recorded by Maud Deed, MD on 10/31/2018 at 3:16 PM CDT  "Looks benign. We can schedule for resection. 35min. ICC". Order placed for POEM. Email sent to scheduling to schedule on 7/15. Once on schedule, Pre-cert will work on Biochemist, clinical.     Routing to Dr. Caesar Chestnut as Juluis Rainier.

## 2018-11-01 NOTE — Telephone Encounter
We also need to pre-admit him the night before.   Forwarding to Dr. Patrick North, Juluis Rainier.

## 2018-11-02 ENCOUNTER — Ambulatory Visit: Admit: 2018-11-02 | Discharge: 2018-11-02

## 2018-11-02 DIAGNOSIS — K224 Dyskinesia of esophagus: Secondary | ICD-10-CM

## 2018-11-02 DIAGNOSIS — K220 Achalasia of cardia: Secondary | ICD-10-CM

## 2018-11-02 DIAGNOSIS — R1310 Dysphagia, unspecified: Secondary | ICD-10-CM

## 2018-11-10 ENCOUNTER — Encounter: Admit: 2018-11-10 | Discharge: 2018-11-10

## 2018-11-11 ENCOUNTER — Encounter: Admit: 2018-11-11 | Discharge: 2018-11-11

## 2018-11-11 NOTE — Telephone Encounter
Call [facility] at [phone]. Was informed they do not preform pre-procedure Covid tests over the weekend. Only Monday-Friday. Scheduled pt for Covid test at 0900 for Drive Through swab collection on 7/13. Trun around  Attempted to call Juliann Pulse, pts daughter. LVM stating covid test cannot be Sunday. Is scheduled Monday and provided date/time and scheduling number if any questions. Sent Mychart message with same info.  Called pt's wife, Inez Catalina. Pt also on the phone.  Reviewed all the above info. Provided scheduling line number at [phone] if any questions for [place] covid swab.  Pt verbalized understanding. Informed pt admission will be sometime Tuesday 7/14 evening once there is a bed available. Bed placement will call Juliann Pulse (preferred number on pts chart). POEM is 7/15.  Pt verbalized understanding. Had no further questions or concerns.

## 2018-11-14 ENCOUNTER — Encounter: Admit: 2018-11-14 | Discharge: 2018-11-14

## 2018-11-14 NOTE — Telephone Encounter
Recieved the below email:    From: Isaiah Blakes   Sent: Monday, November 14, 2018 8:13 AM  To: Shamica Moree @[email or web address]    Hi just left you a message.   I cancelled Mr. Duanne Duchesne of [facility] (talked to him, daughter and Dr. Candi Leash). pls let Berline Chough know to schedule a telehealth visit for him on Wed morning (per Dr. Candi Leash). Daughter said she can do Zoom, but hasn't tried it though my chart yet.   Thanks,     -------------------------------------------------    Email sent to Dr. Nani Skillern covering nurse and scheduler to arrange telehealth appt as directed above. Mychart message sent to pt/Kathy to inform her to call our office is no call today to arrange appt.

## 2018-11-15 ENCOUNTER — Encounter: Admit: 2018-11-15 | Discharge: 2018-11-15

## 2018-11-15 NOTE — Telephone Encounter
Email sent to covering nurse for Dr. Candi Leash and Dr. Candi Leash scheduling team member to contact pt/Kathy today at preferred number on chart.

## 2018-11-16 ENCOUNTER — Ambulatory Visit: Admit: 2018-11-16 | Discharge: 2018-11-16

## 2018-11-16 ENCOUNTER — Encounter: Admit: 2018-11-16 | Discharge: 2018-11-16

## 2018-11-16 DIAGNOSIS — R569 Unspecified convulsions: Secondary | ICD-10-CM

## 2018-11-16 DIAGNOSIS — K759 Inflammatory liver disease, unspecified: Secondary | ICD-10-CM

## 2018-11-16 DIAGNOSIS — G4700 Insomnia, unspecified: Secondary | ICD-10-CM

## 2018-11-16 DIAGNOSIS — K929 Disease of digestive system, unspecified: Secondary | ICD-10-CM

## 2018-11-16 DIAGNOSIS — K220 Achalasia of cardia: Principal | ICD-10-CM

## 2018-11-16 NOTE — Progress Notes
Telehealth Visit Note    Date of Service: 11/16/2018    Subjective:      Obtained patient's verbal consent to treat them and their agreement to [facility] financial policy and NPP via this telehealth visit during the [facility] Emergency       Evan Stevens is a 78 y.o. male.    History of Present Illness     This is a 78 year old Caucasian male who is been referred to [place] by Dr. Burnis Medin for management of achalasia.  Patient has been complaining of dysphagia for more than a year.  He has difficulty swallowing solids as well as liquids.  He is lost about 25 pounds since January of this year.  In November 2019 he was admitted with aspiration pneumonia and empyema.  He also complains of regurgitation and chest pain.  His Eckardt score is 7/12.  He has undergone upper endoscopy with dilation with a 20 mm balloon in [place] without any improvement in his symptoms.  He does not take any antithrombotics.  HRM showed elevated integrated relaxation pressure of the lower esophageal sphincter (16 mm Hg).  The esophageal contractions were peristaltic with weak contractions seen in 4-5 swallows.  EUS revealed thickened muscularis propria at the GE junction to 6 mm.  Endoflip revealed low distensibility index at the GE junction with weak contraction suggestive of achalasia.  Esophagram also showed narrowing at the GE junction with delayed bolus transit.  Patient has had extensive discussion with Dr. Burnis Medin regarding management of achalasia and has decided that he wants to proceed with peroral endoscopic myotomy       Review of Systems   Constitutional: Positive for appetite change and unexpected weight change.   HENT: Positive for trouble swallowing and voice change.    Respiratory: Positive for cough, choking, shortness of breath and wheezing.    Cardiovascular: Positive for chest pain.   Gastrointestinal: Positive for abdominal pain, nausea and vomiting.   Neurological: Positive for dizziness, seizures, weakness and light-headedness.   Psychiatric/Behavioral: Positive for agitation and sleep disturbance. The patient is nervous/anxious.    Comprehensive 10 point ROS taken, and otherwise negative except as above.    Active Ambulatory Problems     Diagnosis Date Noted   ??? No Active Ambulatory Problems     Resolved Ambulatory Problems     Diagnosis Date Noted   ??? No Resolved Ambulatory Problems     Past Medical History:   Diagnosis Date   ??? Gastrointestinal disorder    ??? Hepatitis    ??? Insomnia    ??? Seizures (HCC)        Social History     Socioeconomic History   ??? Marital status: Married     Spouse name: Not on file   ??? Number of children: Not on file   ??? Years of education: Not on file   ??? Highest education level: Not on file   Occupational History   ??? Not on file   Tobacco Use   ??? Smoking status: Never Smoker   ??? Smokeless tobacco: Former Estate agent and Sexual Activity   ??? Alcohol use: Yes   ??? Drug use: Never   ??? Sexual activity: Not on file   Other Topics Concern   ??? Not on file   Social History Narrative   ??? Not on file       No family history on file.    No Known Allergies    There are no active  problems to display for this patient.          Objective:         ??? ALPRAZolam (XANAX) 0.5 mg tablet Take 0.5 mg by mouth.   ??? aspirin EC 81 mg tablet Take 81 mg by mouth daily.   ??? calcium carbonate/vitamin D-3 (OSCAL-500+D) 1250 mg/200 unit tablet Take 1 tablet by mouth daily.   ??? famotidine (PEPCID) 20 mg tablet    ??? gabapentin (NEURONTIN) 300 mg capsule Take 300 mg by mouth.   ??? HYDROcodone/acetaminophen (NORCO) 5/325 mg tablet Take 1 tablet by mouth   ??? lamoTRIgine (LAMICTAL) 200 mg tablet Take 200 mg by mouth daily.   ??? ondansetron (ZOFRAN ODT) 4 mg rapid dissolve tablet DISSOLVE 1 TABLET IN MOUTH EVERY 6 HOURS AS NEEDED FOR NAUSEA AND VOMITING   ??? Pantoprazole 40 mg grps    ??? sertraline (ZOLOFT) 50 mg tablet Take 50 mg by mouth daily.   ??? sucralfate (CARAFATE) 1 gram tablet Take 1 g by mouth three times daily before meals.   ??? tamsulosin (FLOMAX) 0.4 mg capsule Take 0.4 mg by mouth daily.     Vitals:    11/16/18 0719   Weight: 59.9 kg (132 lb)   Height: 180.3 cm (71)   PainSc: Eight     Body mass index is 18.41 kg/m???.     Physical Exam     deferred as this was a telehealth visit       Assessment and Plan:    This is a 78 year old Caucasian male who is been referred to [place] by Dr. Burnis Medin for management of achalasia.  Patient has been complaining of dysphagia for more than a year.  He has difficulty swallowing solids as well as liquids.  He is lost about 25 pounds since January of this year.  In November 2019 he was admitted with aspiration pneumonia and empyema.  He also complains of regurgitation and chest pain.  His Eckardt score is 7/12.  He has undergone upper endoscopy with dilation with a 20 mm balloon in [place] without any improvement in his symptoms.  He does not take any antithrombotics.  HRM showed elevated integrated relaxation pressure of the lower esophageal sphincter (16 mm Hg).  The esophageal contractions were peristaltic with weak contractions seen in 4-5 swallows.  EUS revealed thickened muscularis propria at the GE junction to 6 mm.  Endoflip revealed low distensibility index at the GE junction with weak contraction suggestive of achalasia.  Esophagram also showed narrowing at the GE junction with delayed bolus transit.  Patient has had extensive discussion with Dr. Burnis Medin regarding management of achalasia and has decided that he wants to proceed with peroral endoscopic myotomy    I discussed with the patient the pathophysiology, clinical manifestation and the management of achalasia in detail.  This discussion was conducted over more than 40 minutes. The different treatment options that include Botox injection, pneumatic dilation, Heller myotomy as well as per oral endoscopic myotomy were discussed.  The pros and cons of each approach were discussed.  We specifically discussed in detail regarding per oral endoscopic myotomy. The complications involved that include chest pain, infection, bleeding, perforation, emergency and need for surgery at etc. were discussed.  I also discussed the main differences between [facility] myotomy as well as per oral endoscopic myotomy.  I explained to the patient that if he wants to opt for [facility] myotomy then we will refer him to 1 of our thoracic surgeons.  If however he  wants to opt for per oral endoscopic myotomy  wee will then proceed with applying for preauthorization from his insurance carrier.  Once this is approved he will be scheduled for the procedure.  Patient has already decided that he wants to pursue per oral endoscopic myotomy.  He understands the risk of the procedure.  He also understand that there is a 30 to 40% chance of significant gastroesophageal reflux post POEM.  We will schedule him for per oral endoscopic myotomy.  Follow-up on an as-needed basis.  Thank you for allowing [place] to participate in his care and please feel free to call us if you have any questions.                             40  minutes spent on this patient's encounter with counseling and coordination of care taking >50% of the visit.

## 2018-11-16 NOTE — Telephone Encounter
Eckardt score is now uploaded to pts chart.

## 2018-11-18 ENCOUNTER — Encounter: Admit: 2018-11-18 | Discharge: 2018-11-18

## 2018-11-22 ENCOUNTER — Encounter: Admit: 2018-11-22 | Discharge: 2018-11-22

## 2018-11-22 DIAGNOSIS — K220 Achalasia of cardia: Secondary | ICD-10-CM

## 2018-11-22 MED ORDER — SODIUM CHLORIDE 0.9 % IV SOLP
INTRAVENOUS | 0 refills | Status: CN
Start: 2018-11-22 — End: ?

## 2018-11-23 DIAGNOSIS — K220 Achalasia of cardia: Principal | ICD-10-CM

## 2018-11-25 ENCOUNTER — Encounter: Admit: 2018-11-25 | Discharge: 2018-11-25

## 2018-11-28 NOTE — Progress Notes
Called pt to discuss Covid test prior to admission on 12/05/18 for procedure on 12/06/18.  Spoke with pt's daughter.  She confirmed his Covid test has been set up for Friday, 12/02/18 in , .

## 2018-12-02 ENCOUNTER — Encounter: Admit: 2018-12-02 | Discharge: 2018-12-02

## 2018-12-05 ENCOUNTER — Encounter: Admit: 2018-12-05 | Discharge: 2018-12-05

## 2018-12-05 MED ORDER — ONDANSETRON 4 MG PO TBDI
4 mg | ORAL | 0 refills | Status: DC | PRN
Start: 2018-12-05 — End: 2018-12-09

## 2018-12-05 MED ORDER — LAMOTRIGINE 200 MG PO TAB
200 mg | Freq: Every day | ORAL | 0 refills | Status: DC
Start: 2018-12-05 — End: 2018-12-09
  Administered 2018-12-05 – 2018-12-06 (×2): 200 mg via ORAL

## 2018-12-05 MED ORDER — GABAPENTIN 300 MG PO CAP
300 mg | Freq: Every day | ORAL | 0 refills | Status: DC
Start: 2018-12-05 — End: 2018-12-09
  Administered 2018-12-05 – 2018-12-06 (×2): 300 mg via ORAL

## 2018-12-05 MED ORDER — TAMSULOSIN 0.4 MG PO CAP
.4 mg | Freq: Every day | ORAL | 0 refills | Status: DC
Start: 2018-12-05 — End: 2018-12-09
  Administered 2018-12-05 – 2018-12-06 (×2): 0.4 mg via ORAL

## 2018-12-05 MED ORDER — SUCRALFATE 1 GRAM PO TAB
1 g | Freq: Three times a day (TID) | ORAL | 0 refills | Status: DC
Start: 2018-12-05 — End: 2018-12-09
  Administered 2018-12-05 – 2018-12-06 (×2): 1 g via ORAL

## 2018-12-05 MED ORDER — ALPRAZOLAM 0.5 MG PO TAB
.5 mg | ORAL | 0 refills | Status: DC | PRN
Start: 2018-12-05 — End: 2018-12-07
  Administered 2018-12-06: 03:00:00 0.5 mg via ORAL

## 2018-12-05 MED ORDER — FAMOTIDINE 20 MG PO TAB
20 mg | Freq: Every day | ORAL | 0 refills | Status: DC
Start: 2018-12-05 — End: 2018-12-09
  Administered 2018-12-05 – 2018-12-06 (×2): 20 mg via ORAL

## 2018-12-05 MED ORDER — CALCIUM CARBONATE-VITAMIN D3 500 MG(1,250MG) -200 UNIT PO TAB
1 | Freq: Every day | ORAL | 0 refills | Status: DC
Start: 2018-12-05 — End: 2018-12-09
  Administered 2018-12-05 – 2018-12-06 (×2): 1 via ORAL

## 2018-12-05 MED ORDER — SERTRALINE 50 MG PO TAB
50 mg | Freq: Every day | ORAL | 0 refills | Status: DC
Start: 2018-12-05 — End: 2018-12-09
  Administered 2018-12-05 – 2018-12-06 (×2): 50 mg via ORAL

## 2018-12-05 NOTE — H&P (View-Only)
Admission History and Physical Examination      Name:  Evan Stevens                                             MRN:  [id number]   Admission Date:  12/05/2018                     Assessment/Plan:    Active Problems:    * No active hospital problems. *      78 year old male with PMH significant for siezure disorder and achalasia who presents for planned POEM procedure with Dr Angelyn Punt     Achalasia  -Symptoms persisted for over a year with dysphagia or solids and liquids  -Planned POEM on 8/4  -COVID 19 negative done on 7/31 in [place] will need to confirm.  -Make NPO at midnight  -Consult GI for co-management    Seizure disorder  PTA lamictal    GERD  -PTA  protonix    Depression  -Continue Sertraline    FEN: CLD  DVT ppx:  Mechanical ppx only due to procedure tomorrow   DNAR-FI  Does not want shocks or compression but ok with mechanical ventilation.  May need to readdress as he is here for upcomming procedure.    __________________________________________________________________________________  Primary Care Physician: Benjie Karvonen  Verified    Chief Complaint:  Dysphagia  History of Present Illness: Evan Stevens is a 78 y.o. male  with PMH significant for siezure disorder and achalasia who presents for planned POEM procedure with Dr Angelyn Punt.  He denies any sick contacts, fevers, chills nausea or emesis        Medical History:   Diagnosis Date   ??? Gastrointestinal disorder     GERD   ??? Hepatitis     HX OF LONG TIME AGO.   ??? Insomnia    ??? Seizures (HCC)      Surgical History:   Procedure Laterality Date   ??? BACK SURGERY  1980   ??? EGD w/ Endoflip N/A 10/06/2018    Performed by Jolee Ewing, MD at [facility] ENDO   ??? ESOPHAGEAL BALLOON DISTENSION STUDY WITH/ WITHOUT PROVOCATION - DIAGNOSTIC N/A 10/06/2018    Performed by Jolee Ewing, MD at [facility] ENDO   ??? ESOPHAGOGASTRODUODENOSCOPY WITH BIOPSY - FLEXIBLE N/A 10/06/2018    Performed by Jolee Ewing, MD at [facility] ENDO ??? ESOPHAGEAL MOTILITY STUDY N/A 10/06/2018    Performed by Jolee Ewing, MD at [facility] ENDO   ??? ESOPHAGOGASTRODUODENOSCOPY WITH ENDOSCOPIC ULTRASOUND EXAMINATION - FLEXIBLE N/A 10/10/2018    Performed by Comer Locket, MD at [facility] ENDO     Family history reviewed; non-contributory  Social History     Socioeconomic History   ??? Marital status: Married     Spouse name: Not on file   ??? Number of children: Not on file   ??? Years of education: Not on file   ??? Highest education level: Not on file   Occupational History   ??? Not on file   Social Needs   ??? Financial resource strain: Not on file   ??? Food insecurity     Worry: Not on file     Inability: Not on file   ??? Transportation needs     Medical: Not on file     Non-medical: Not on file   Tobacco  Use   ??? Smoking status: Never Smoker   ??? Smokeless tobacco: Former Estate agent and Sexual Activity   ??? Alcohol use: Yes   ??? Drug use: Never   ??? Sexual activity: Not on file   Lifestyle   ??? Physical activity     Days per week: Not on file     Minutes per session: Not on file   ??? Stress: Not on file   Relationships   ??? Social Wellsite geologist on phone: Not on file     Gets together: Not on file     Attends religious service: Not on file     Active member of club or organization: Not on file     Attends meetings of clubs or organizations: Not on file     Relationship status: Not on file   ??? Intimate partner violence     Fear of current or ex partner: Not on file     Emotionally abused: Not on file     Physically abused: Not on file     Forced sexual activity: Not on file   Other Topics Concern   ??? Not on file   Social History Narrative   ??? Not on file            Immunizations (includes history and patient reported):   There is no immunization history on file for this patient.        Allergies:  Patient has no known allergies.    Medications:  Medications Prior to Admission   Medication Sig   ??? ALPRAZolam (XANAX) 0.5 mg tablet Take 0.5 mg by mouth. ??? aspirin EC 81 mg tablet Take 81 mg by mouth daily.   ??? calcium carbonate/vitamin D-3 (OSCAL-500+D) 1250 mg/200 unit tablet Take 1 tablet by mouth daily.   ??? famotidine (PEPCID) 20 mg tablet    ??? gabapentin (NEURONTIN) 300 mg capsule Take 300 mg by mouth.   ??? HYDROcodone/acetaminophen (NORCO) 5/325 mg tablet Take 1 tablet by mouth   ??? lamoTRIgine (LAMICTAL) 200 mg tablet Take 200 mg by mouth daily.   ??? ondansetron (ZOFRAN ODT) 4 mg rapid dissolve tablet DISSOLVE 1 TABLET IN MOUTH EVERY 6 HOURS AS NEEDED FOR NAUSEA AND VOMITING   ??? Pantoprazole 40 mg grps    ??? sertraline (ZOLOFT) 50 mg tablet Take 50 mg by mouth daily.   ??? sucralfate (CARAFATE) 1 gram tablet Take 1 g by mouth three times daily before meals.   ??? tamsulosin (FLOMAX) 0.4 mg capsule Take 0.4 mg by mouth daily.     Review of Systems:  Rest of 14 ROS is negative other than what is stated in HPI        Physical Exam:  Vital Signs: Last Filed In 24 Hours Vital Signs: 24 Hour Range              General:  Alert, cooperative, no distress, appears stated age  Head:  Normocephalic, without obvious abnormality, atraumatic  Lungs:  Clear to auscultation bilaterally  Heart:    Regular rate and rhythm, S1, S2 normal, no murmur, click rub or gallop  Abdomen:  Soft, non-tender.  Bowel sounds normal.  No masses.  No organomegaly.  Extremities:  Extremities normal, atraumatic, no cyanosis or edema  Peripheral pulses:   2+ and symmetric, all extremities  Skin:  No erythema or focal lesions      Lab/Radiology/Other Diagnostic Tests:  24-hour labs:  No results found for  this visit on 12/05/18 (from the past 24 hour(s)).     Pertinent radiology reviewed.    Valeen Borys, DO  Pager [phone]

## 2018-12-06 ENCOUNTER — Encounter: Admit: 2018-12-06 | Discharge: 2018-12-06

## 2018-12-06 ENCOUNTER — Ambulatory Visit: Admit: 2018-12-05 | Discharge: 2018-12-09 | Disposition: A | Discharge status: Home / Self Care

## 2018-12-06 ENCOUNTER — Ambulatory Visit: Admit: 2018-12-06 | Discharge: 2018-12-06

## 2018-12-06 DIAGNOSIS — G4700 Insomnia, unspecified: Secondary | ICD-10-CM

## 2018-12-06 DIAGNOSIS — K759 Inflammatory liver disease, unspecified: Secondary | ICD-10-CM

## 2018-12-06 DIAGNOSIS — K929 Disease of digestive system, unspecified: Secondary | ICD-10-CM

## 2018-12-06 DIAGNOSIS — R569 Unspecified convulsions: Secondary | ICD-10-CM

## 2018-12-06 DIAGNOSIS — K220 Achalasia of cardia: Principal | ICD-10-CM

## 2018-12-06 LAB — COMPREHENSIVE METABOLIC PANEL
Lab: 0.6 mg/dL (ref 0.3–1.2)
Lab: 1.1 mg/dL (ref 0.4–1.24)
Lab: 105 MMOL/L (ref 98–110)
Lab: 11 U/L (ref 7–56)
Lab: 126 mg/dL — ABNORMAL HIGH (ref 70–100)
Lab: 137 MMOL/L (ref 137–147)
Lab: 16 U/L (ref 7–40)
Lab: 17 mg/dL (ref 7–25)
Lab: 24 MMOL/L — ABNORMAL HIGH (ref 21–30)
Lab: 3.9 MMOL/L (ref 3.5–5.1)
Lab: 4 g/dL (ref 3.5–5.0)
Lab: 7 g/dL (ref 6.0–8.0)
Lab: 8 10*3/uL (ref 3–12)
Lab: 9.5 mg/dL (ref 8.5–10.6)
Lab: 95 U/L (ref 25–110)
Lab: >60 mL/min (ref >60)
Lab: >60 mL/min (ref >60)

## 2018-12-06 LAB — CBC AND DIFF
Lab: 0 10*3/uL (ref 0–0.20)
Lab: 0.6 10*3/uL — ABNORMAL HIGH (ref 0–0.45)
Lab: 6.5 10*3/uL (ref 4.5–11.0)

## 2018-12-06 LAB — BASIC METABOLIC PANEL
Lab: 139 MMOL/L — ABNORMAL HIGH (ref 137–147)
Lab: 4 MMOL/L — ABNORMAL LOW (ref 3.5–5.1)

## 2018-12-06 LAB — CBC: Lab: 6 K/UL — ABNORMAL HIGH (ref >60)

## 2018-12-06 MED ORDER — ALBUTEROL SULFATE 90 MCG/ACTUATION IN HFAA
2 | Freq: Every evening | RESPIRATORY_TRACT | 0 refills | Status: DC
Start: 2018-12-06 — End: 2018-12-09
  Administered 2018-12-06: 08:00:00 2 via RESPIRATORY_TRACT

## 2018-12-06 MED ORDER — ROCURONIUM 10 MG/ML IV SOLN
0 refills | Status: DC
Start: 2018-12-06 — End: 2018-12-06
  Administered 2018-12-06: 22:00:00 20 mg via INTRAVENOUS

## 2018-12-06 MED ORDER — ONDANSETRON HCL (PF) 4 MG/2 ML IJ SOLN
INTRAVENOUS | 0 refills | Status: DC
Start: 2018-12-06 — End: 2018-12-06
  Administered 2018-12-06: 23:00:00 4 mg via INTRAVENOUS

## 2018-12-06 MED ORDER — DEXAMETHASONE SODIUM PHOSPHATE 4 MG/ML IJ SOLN
INTRAVENOUS | 0 refills | Status: DC
Start: 2018-12-06 — End: 2018-12-06
  Administered 2018-12-06: 22:00:00 4 mg via INTRAVENOUS

## 2018-12-06 MED ORDER — LACTATED RINGERS IV SOLP
1000 mL | INTRAVENOUS | 0 refills | Status: DC
Start: 2018-12-06 — End: 2018-12-09
  Administered 2018-12-06 (×2): 1000.000 mL via INTRAVENOUS

## 2018-12-06 MED ORDER — EPHEDRINE SULFATE 50 MG/ML IJ SOLN
0 refills | Status: DC
Start: 2018-12-06 — End: 2018-12-06

## 2018-12-06 MED ORDER — PHENYLEPHRINE IN 0.9% NACL(PF) 1 MG/10 ML (100 MCG/ML) IV SYRG
INTRAVENOUS | 0 refills | Status: DC
Start: 2018-12-06 — End: 2018-12-06
  Administered 2018-12-06 (×2): 100 ug via INTRAVENOUS
  Administered 2018-12-06: 22:00:00 200 ug via INTRAVENOUS
  Administered 2018-12-06 (×2): 100 ug via INTRAVENOUS
  Administered 2018-12-06: 22:00:00 200 ug via INTRAVENOUS
  Administered 2018-12-06: 22:00:00 100 ug via INTRAVENOUS
  Administered 2018-12-06 (×2): 200 ug via INTRAVENOUS

## 2018-12-06 MED ORDER — LIDOCAINE (PF) 200 MG/10 ML (2 %) IJ SYRG
0 refills | Status: DC
Start: 2018-12-06 — End: 2018-12-06
  Administered 2018-12-06: 21:00:00 60 mg via INTRAVENOUS

## 2018-12-06 MED ORDER — PIPERACILLIN/TAZOBACTAM 3.375 G/NS IVPB (MB+)
3.375 g | INTRAVENOUS | 0 refills | Status: DC
Start: 2018-12-06 — End: 2018-12-09
  Administered 2018-12-07 – 2018-12-09 (×20): 3.375 g via INTRAVENOUS

## 2018-12-06 MED ORDER — LORAZEPAM 2 MG/ML IJ SOLN
.5 mg | INTRAVENOUS | 0 refills | Status: DC | PRN
Start: 2018-12-06 — End: 2018-12-09
  Administered 2018-12-08 – 2018-12-09 (×2): 0.5 mg via INTRAVENOUS

## 2018-12-06 MED ORDER — MELATONIN 3 MG PO TAB
3 mg | Freq: Every evening | ORAL | 0 refills | Status: DC | PRN
Start: 2018-12-06 — End: 2018-12-09
  Administered 2018-12-06: 07:00:00 3 mg via ORAL

## 2018-12-06 MED ORDER — SUCCINYLCHOLINE CHLORIDE 20 MG/ML IJ SOLN
INTRAVENOUS | 0 refills | Status: DC
Start: 2018-12-06 — End: 2018-12-06
  Administered 2018-12-06: 21:00:00 80 mg via INTRAVENOUS

## 2018-12-06 MED ORDER — SUGAMMADEX 100 MG/ML IV SOLN
0 refills | Status: DC
Start: 2018-12-06 — End: 2018-12-06
  Administered 2018-12-06: 23:00:00 114 mg via INTRAVENOUS

## 2018-12-06 MED ORDER — FENTANYL CITRATE (PF) 50 MCG/ML IJ SOLN
0 refills | Status: DC
Start: 2018-12-06 — End: 2018-12-06
  Administered 2018-12-06: 22:00:00 25 ug via INTRAVENOUS
  Administered 2018-12-06: 21:00:00 75 ug via INTRAVENOUS

## 2018-12-06 MED ORDER — PHENYLEPHRINE 40 MCG/ML IN NS IV DRIP (STD CONC)
0 refills | Status: DC
Start: 2018-12-06 — End: 2018-12-06
  Administered 2018-12-06 (×2): 0.4 ug/kg/min via INTRAVENOUS

## 2018-12-06 MED ORDER — FENTANYL CITRATE (PF) 50 MCG/ML IJ SOLN
50 ug | Freq: Once | INTRAVENOUS | 0 refills | Status: CP
Start: 2018-12-06 — End: ?
  Administered 2018-12-06: 50 ug via INTRAVENOUS

## 2018-12-06 MED ORDER — FENTANYL CITRATE (PF) 50 MCG/ML IJ SOLN
12.5-25 ug | INTRAVENOUS | 0 refills | Status: DC | PRN
Start: 2018-12-06 — End: 2018-12-07
  Administered 2018-12-07: 11:00:00 25 ug via INTRAVENOUS
  Administered 2018-12-07 (×2): 12.5 ug via INTRAVENOUS
  Administered 2018-12-07: 05:00:00 25 ug via INTRAVENOUS

## 2018-12-06 MED ORDER — EPHEDRINE SULFATE 50 MG/ML IJ SOLN
0 refills | Status: DC
Start: 2018-12-06 — End: 2018-12-06
  Administered 2018-12-06: 22:00:00 10 mg via INTRAVENOUS

## 2018-12-06 MED ORDER — DEXTRAN 70-HYPROMELLOSE (PF) 0.1-0.3 % OP DPET
0 refills | Status: DC
Start: 2018-12-06 — End: 2018-12-06
  Administered 2018-12-06: 21:00:00 2 [drp] via OPHTHALMIC

## 2018-12-06 MED ORDER — PROPOFOL INJ 10 MG/ML IV VIAL
0 refills | Status: DC
Start: 2018-12-06 — End: 2018-12-06
  Administered 2018-12-06: 21:00:00 140 mg via INTRAVENOUS
  Administered 2018-12-06: 22:00:00 30 mg via INTRAVENOUS

## 2018-12-06 NOTE — Consults
CLINICAL NUTRITION                                                        Clinical Nutrition Initial Assessment    Name: Evan Stevens        MRN: [id number]          DOB: [birth year]          Age: 78 y.o.  Admission Date: 12/05/2018             LOS: 1 day    Recommendation:  ??? Advance diet as medically able to goal of Clear liquid > Full Liquid > Soft per POEM procedure protocol.  ??? REC to check 25-OH vit D, Vitamin B12, Folate at some point to r/o deficiency   ??? If Vit B12 < 400, replace with PO replacement. If below 200, replace IM daily x 7 days or additional per MD recs.   ??? No recent checks of B12, Vit D in the past. Pt takes antiacids/PPI - could be contributing factor for B12 deficit  ??? Hx of MDD - sub optimal 25-OH Vitamin D levels could be a contributing factor    Comments:  78yoM w/ PMH of seizure d/o, MDD and GAD, achalasia who is admitted for POEM procedure. Dietitian consulted for nutrition assessment.     MST score of 3, dietitian consulted for nutritional assessment. Per EMR patient weight was ~155# 07/2018, 143# 08/22/2018, now down to 125# this admission (-12% in 3-4 months, significant). Spoke w/ patient today at bedside w/ daughter. He reported poor PO intake PTA for the past several months, usual weight was ~ 165#, confirmed wt loss hx above. PTA PO Intake was ~ 3 boost per day + maybe 1 solid meal every 24-48 hours, reporting his food didn't seem to digest and he wouldn't be able to eat another meal for a day or so. He meets criteria for malnutrition w/ nutrition focused physical exam - see below for criteria. Denied vitamins/minerals other than calcium + Vit D - concern for vitamin deficiencies due to malnutrition - encouraged vitamin/mineral supplementation of MVI after d/c. Provided clear liquid, full liquid and soft diet education, explained usual protocol s/p POEM procedure & nutrition supplements that may be best during that time. He understood, they had a few questions all which were answered by this RD - contact info in d/c summary and handouts provided at beside.     Nutrition Assessment of Patient:  Usual Weight: 74.8 kg;    BMI (Calculated): 17.55; BMI Categories Adult: Under Weight: < 18.5(BMI 17.6); Appearance: Thin  Pertinent Allergies/Intolerances: None  Pertinent Labs: reviewed; Pertinent Meds: reviewed;     Labs/meds reviewed: Vit D, pepcid, zofran.     Oral Diet Order: NPO at midnight;      Current Oral Intake: NPO           Estimated Calorie Needs: 4540-9811(91-47 kcal/kg CBW)  Estimated Protein Needs: 68-85(1.2-1.5 g/kg CBW)    Malnutrition Assessment:   Malnutrition present; ICD-10 code E43: Acute illness/Severe malnutrition;  Weight loss: > 7.5% x 3 months, Energy intake: < 50% of estimated energy requirement for 5 days or more;   Malnutrition Interventions: Provided nutrition education, assessed adequacy/tolerance of PO intake     Nutrition Focused Physical Assessment:   Loss of Subcutaneous Fat: Yes; Severity: Moderate;  Location: Triceps  Muscle Wasting: Yes; Severity: Moderate; Location: Clavicle  Edema: No;      Pressure Injury: none      Comment: Soft;Non-distended;Non-tender; +BM PTA    Nutrition Diagnosis:  Malnutrition  Etiology: achalasia/altered GI function  Signs & Symptoms: unintended wt loss, inadeuqate PO intake                      Intervention / Plan:  Monitor wt, labs, meds, GI symptoms, Diet advancement          Goals:  Avoid prolonged NPO status  Time Frame: Within 72 hours  Verbalize understanding of diet  Time Frame: Prior to discharge  Status: Met           Konrad Saha, MS, RD, LD, CNSC    [place]: [phone] (Preferred Communication Method)  Pager: [phone]*

## 2018-12-06 NOTE — Case Management (ED)
Case Management Admission Assessment    NAME:Evan Stevens                          MRN: [id number]             DOB:May 18, 1940          AGE: 78 y.o.  ADMISSION DATE: 12/05/2018             DAYS ADMITTED: LOS: 1 day      Today???s Date: 12/06/2018    Source of Information: Patient    Per EMR, pt with PMH of seizure d/o, MDD and GAD, achalasia who was admitted for POEM procedure.         Plan  Plan: Case Management Assessment, Assist PRN with SW/NCM Services, Discharge Planning for Home Anticipated   ??? This CM met with pt via phone for assessment on this date due to COVID 19 precautions.  Provided contact information and explanation of SW/NCM roles.  Reviewed [facility], Preparing for Discharge, and Preferred Provider Network hand-outs.  Provided opportunity for questions and discussion. Pt/family encouraged to contact Case Management team with questions and concerns during hospitalization and until patient is able to transition back to the patient's primary care physician.  ??? Pt lives at home with his wife in a single level home.    ??? Pt reports being independent prior to admission with his ADL's. He has a cane which he does not use.  ??? No prior SNF or IPR stays.  He has done OP PT in the past at local hospital.    ??? Pt's wife has had a couple strokes and an MI and he has to help her at times.  They have 2 kids and his wife has 3 as well.  Her son lives nearby to them.    ??? Pt does not have drug coverage and either gets his meds at the [place], [place] or at [facility].    ??? Pt's daughter Olegario Messier will provide transportation home  ??? He would like to fill any Rx's at [facility] in [facility].     Patient Address/Phone  [address] [place] [address]  [phone] (home)     Emergency Contact  Extended Emergency Contact Information  Primary Emergency Contact: Charlesworth,BETTY  Home Phone: [phone]  Relation: Spouse  Secondary Emergency Contact: Constance Goltz  Home Phone: [phone]  Relation: Daughter Interpreter needed? No    Healthcare Directive     Pt reports he has a DPOA and his daughter Clydie Braun is listed as DPOA.  He does not wish to complete a new one at this time and does not have access to a copy in the hospital.    Transportation  Does the patient need discharge transport arranged?: No  Transportation Name, Phone and Availability #1: daughter Olegario Messier to provide transportation home  Does the patient use Medicaid Transportation?: No    Expected Discharge Date  Expected Discharge Date: 12/09/18    Living Situation Prior to Admission  ? Living Arrangements  Type of Residence: Home, independent  Living Arrangements: Spouse/significant other  Financial risk analyst / Tub: Tub/Shower Unit  How many levels in the residence?: 1(with basement)  Can patient live on one level if needed?: Yes  Does residence have entry and/or side stairs?: Yes(2)  Assistance needed prior to admit or anticipated on discharge: No  ? Level of Function   Prior level of function: Independent  ?  Cognitive Abilities   Cognitive Abilities: Alert and Oriented, Participates in decision making, Recognizes impact of health condition on lifestyle    Financial Resources  ? Coverage  Primary Insurance: Medicare  Secondary Insurance: Medicare Supplement(UHC)  Additional Coverage: VA(No drug coverage, gets meds at either [place], [place] or at [facility] in [place])    ? Source of Income   Source Of Income: SSI  ? Financial Assistance Needed?  none    Psychosocial Needs  ? Mental Health  Mental Health History: Yes  Mental Health Provider: PCP prescribes anxiety medication  Mental Health Symptoms: Excessive fears/worries  ? Substance Use History  Substance Use History Screen: No  ? Other  none    Current/Previous Services  ? PCP  Benjie Karvonen, [phone], [phone]   Pt reports he saw his PCP about 2 months ago.  ? Pharmacy    [facility] [place], [place] - [address] [place] [address]  [address]  [place] [address] Phone: [phone] Fax: [phone]    ? Durable Medical Equipment   Durable Medical Equipment at home: Single Point Cane(does not use the cane)  ? Home Health  Receiving home health: No  ? Hemodialysis or Peritoneal Dialysis  Undergoing hemodialysis or peritoneal dialysis: No  ? Tube/Enteral Feeds  Receive tube/enteral feeds: No  ? Infusion  Receive infusions: No  ? Private Duty  Private duty help used: No  ? Home and Community Based Services  Home and community based services: No  ? Ryan White  Ryan White: No  ? Hospice  Hospice: No  ? Outpatient Therapy  PT: In the past  Name of rehab location/group: [facility]  OT: No  SLP: No  ? Skilled Nursing Facility/Nursing Home  SNF: No  NH: No  ? Inpatient Rehab  IPR: No  ? Long-Term Acute Care Hospital  LTACH: No  ? Acute Hospital Stay  Acute Hospital Stay: No    Maren Beach, BSN, RN, [facility]  Integrated Nurse Case Manager  Ph [phone]  Pg [phone]

## 2018-12-06 NOTE — H&P (View-Only)
Pre Procedure History and Physical/Sedation Plan    Name:Evan Stevens                                                                   MRN: [id number]                 DOB:Jul 10, 1940          Age: 78 y.o.  Admission Date: 12/05/2018             Days Admitted: LOS: 1 day      Procedure Date:  12/06/2018    Planned Procedure(s):  GI:  POEM  Sedation/Medication Plan:  General Anesthesia  Discussion/Reviews:  Physician has discussed risks and alternatives of this type of sedation and above planned procedures with patient  ________________________________________________________________  Chief Complaint:  Inpatient endo consult note reviewed.      Previous Anesthetic/Sedation History:  GA    Allergies:  Patient has no known allergies.  Medications:  Scheduled Meds:[MAR Hold] albuterol sulfate (PROAIR HFA) inhaler 2 puff, 2 puff, Inhalation, QHS  [MAR Hold] calcium carbonate/vitamin D-3 (OSCAL-500+D) 1250 mg/200 unit tablet 1 tablet, 1 tablet, Oral, QDAY  [MAR Hold] famotidine (PEPCID) tablet 20 mg, 20 mg, Oral, QDAY  gabapentin (NEURONTIN) capsule 300 mg, 300 mg, Oral, QDAY  lamoTRIgine (LaMICtal) tablet 200 mg, 200 mg, Oral, QDAY  [MAR Hold] sertraline (ZOLOFT) tablet 50 mg, 50 mg, Oral, QDAY  [MAR Hold] sucralfate (CARAFATE) tablet 1 g, 1 g, Oral, TID before meals  [MAR Hold] tamsulosin (FLOMAX) capsule 0.4 mg, 0.4 mg, Oral, QDAY    Continuous Infusions:  ??? lactated ringers infusion       PRN and Respiratory Meds:[MAR Hold] ALPRAZolam Q6H PRN, [MAR Hold] melatonin QHS PRN, [MAR Hold] ondansetron Q6H PRN       Vital Signs:  Last Filed Vital Signs: 24 Hour Range   BP: 137/76 (08/04 1515)  Temp: 36.5 ???C (97.7 ???F) (08/04 1515)  Pulse: 77 (08/04 1515)  Respirations: 15 PER MINUTE (08/04 1515)  SpO2: 98 % (08/04 1515)  Height: 180.3 cm (71) (08/04 1300) BP: (119-148)/(58-80)   Temp:  [36.1 ???C (97 ???F)-36.5 ???C (97.7 ???F)]   Pulse:  [49-77]   Respirations:  [15 PER MINUTE-19 PER MINUTE]   SpO2:  [92 %-98 %]      NPO Status: Airway:  airway assessment performed  Mallampati II (soft palate, uvula, fauces visible)  Anesthesia Classification:  ASA III (A patient with a severe systemic disease that limits activity, but is not incapacitating)  NPO Status: Acceptable  Preganancy Status: N/A    Lab/Radiology/Other Diagnostic Tests  Labs:  Relevant labs reviewed    Kathrynn Ducking, MD  Pager

## 2018-12-06 NOTE — Progress Notes
General Progress Note    Name:  Evan Stevens   Today's Date:  12/06/2018  Admission Date: 12/05/2018  LOS: 1 day                     Assessment/Plan:    Principal Problem:    Achalasia  Active Problems:    Seizure disorder (HCC)    Anxiety and depression    Benign prostatic hyperplasia with lower urinary tract symptoms    Unintentional weight loss    78yoM w/ PMH of seizure d/o, MDD and GAD, achalasia who is admitted for POEM procedure.    Changes made to today's assessment and plan are indicated in bold.    Achalasia  - 30-35lb weight loss over the past one year  - following w/ GI  - plan for POEM procedure today    MDD, GAD  - cont zoloft, PRN ativan    BPH  - cont flomax    Seizure d/o  - cont lamictal, gabapentin    FEN: no IVF, replace lytes PRN, NPO  Ppx: SCD's only  Code: [id number]    Dispo: cont inpt admit, plan for POEM procedure today.  ________________________________________________________________________    Subjective  Evan Stevens is a 78 y.o. male.  Patient w/o acute events since admit.  He's looking forward to have the procedure done.  He's worried about the amount of weight loss he has had over the last year.  Reports last seizure was in 1995.    ROS: neg for fevers, chills, dyspnea, chest pain, palpitations     Medications  Scheduled Meds:albuterol sulfate (PROAIR HFA) inhaler 2 puff, 2 puff, Inhalation, QHS  calcium carbonate/vitamin D-3 (OSCAL-500+D) 1250 mg/200 unit tablet 1 tablet, 1 tablet, Oral, QDAY  famotidine (PEPCID) tablet 20 mg, 20 mg, Oral, QDAY  gabapentin (NEURONTIN) capsule 300 mg, 300 mg, Oral, QDAY  INHALATIONAL SPACING DEVICE MISC SPCR (Cabinet Override), , , NOW  lamoTRIgine (LaMICtal) tablet 200 mg, 200 mg, Oral, QDAY  sertraline (ZOLOFT) tablet 50 mg, 50 mg, Oral, QDAY  sucralfate (CARAFATE) tablet 1 g, 1 g, Oral, TID before meals  tamsulosin (FLOMAX) capsule 0.4 mg, 0.4 mg, Oral, QDAY    Continuous Infusions: PRN and Respiratory Meds:ALPRAZolam Q6H PRN, melatonin QHS PRN, ondansetron Q6H PRN      Objective                       Vital Signs: Last Filed                 Vital Signs: 24 Hour Range   BP: 129/62 (08/04 0629)  Temp: 36.4 ???C (97.5 ???F) (08/04 4540)  Pulse: 49 (08/04 0629)  Respirations: 18 PER MINUTE (08/04 0629)  SpO2: 93 % (08/04 0629) BP: (119-148)/(62-80)   Temp:  [36.1 ???C (97 ???F)-36.5 ???C (97.7 ???F)]   Pulse:  [49-72]   Respirations:  [18 PER MINUTE-19 PER MINUTE]   SpO2:  [92 %-96 %]    Intensity Pain Scale (Self Report): Asleep (12/06/18 0400) Vitals:    12/05/18 1825   Weight: 57.1 kg (125 lb 12.8 oz)       Intake/Output Summary:  (Last 24 hours)    Intake/Output Summary (Last 24 hours) at 12/06/2018 0908  Last data filed at 12/06/2018 0400  Gross per 24 hour   Intake 350 ml   Output ???   Net 350 ml           Physical Exam  General:  Alert, cooperative, no distress, appears stated age.  Thin appearing.  Psych: normal mood and affect  Head:  Normocephalic, without obvious abnormality, atraumatic.  Neck: - JVD  Eyes:  Conjunctivae/corneas clear.   Lungs:  CTAB, no rales, no wheezing.  Heart:    Regular rate and rhythm, no MRG.  Abdomen:  Soft, non-tender.  Bowel sounds normal.    Extremities:  No cyanosis, clubbing, nor edema.  Pulses:   2+ radial pulses b/l  Skin:   Skin color, texture, turgor normal.  No rashes or lesions.    Lab Review  Hematology:    Lab Results   Component Value Date    HGB 14.1 12/06/2018    HCT 41.2 12/06/2018    PLTCT 204 12/06/2018    WBC 6.0 12/06/2018    NEUT 44 12/05/2018    ANC 2.88 12/05/2018    ALC 2.51 12/05/2018    MONA 7 12/05/2018    AMC 0.43 12/05/2018    ABC 0.08 12/05/2018    MCV 93.6 12/06/2018    MCHC 34.3 12/06/2018    MPV 7.5 12/06/2018    RDW 13.6 12/06/2018   , Coagulation:    Lab Results   Component Value Date    INR 1.1 09/14/2018   , General Chemistry:    Lab Results   Component Value Date    NA 139 12/06/2018    K 4.0 12/06/2018    CL 106 12/06/2018 GAP 9 12/06/2018    BUN 15 12/06/2018    CR 1.18 12/06/2018    GLU 78 12/06/2018    CA 8.9 12/06/2018    ALBUMIN 4.0 12/05/2018    TOTBILI 0.6 12/05/2018    and Enzymes:    Lab Results   Component Value Date    AST 16 12/05/2018    ALT 11 12/05/2018    ALKPHOS 95 12/05/2018       Point of Care Testing  (Last 24 hours)  Glucose: 78 (12/06/18 9604)    Radiology and other Diagnostics Review:    No radiology.    Georgiana Shore, MD, FACP  Clinical Assistant Professor  Internal Medicine, Hospital Medicine  Pager:  [phone]

## 2018-12-06 NOTE — Progress Notes
EGD/POEM    Post Upper Endoscopy Instructions    -Strict NPO until Friday    -You may have a sore throat after the procedure for 2-3 days.  Try sucrets or lozenges to help ease the pain.  If it continues please contact us.    -If you feel feverish, have a temperature of 101 degrees or higher, persistent nausea and vomiting, abdominal pain or dark stools; please notify your nurse or GI physician.    -You may have abdominal cramping following the procedure this can be relieved by belching or passing air.    -If you have redness or swelling at the IV site, place a warm, wet washcloth over the affected areas for 15 minutes, 3-4 times a day until the redness subsides.  If symptoms continue for 2-3 days, contact your regular physician.    - If you have bleeding from your mouth, over 2 tablespoons and increasing, please notify your physician.  A small amount of bleeding is normal if a biopsy or polyps were taken.  If you are vomiting blood you need to seek immediate medical attention.    - You may resume all your routine medications, if medications need to be held your physician and/or nurse will notify you post procedure.    SPECIFIC INSTRUCTIONS  INPATIENTS:  Ask for help when you get up in your room, as you may still be drowsy from your sedation.    OUTPATIENTS:  A. Because of sedation and lack of coordination, FOR THE NEXT 24 HOURS, DO NOT:  1. Operate any motorized vehicle - this includes driving.  2. Sign any legal documents or conduct important business matters.  3. Use any dangerous machinery (chain saw, lawnmower, etc.).  4. Drink any alcoholic beverages.  Should you have any questions or concerns after your procedure please call [phone] M-F 8am-5:00 pm. After 5:00 pm, holidays or weekends call [phone] and ask for the GI Doctor on call.

## 2018-12-06 NOTE — Discharge Instructions
Please don't hesitate to contact your registered dietitian if needed:   Contact information:  Samone Guhl Diekemper, MS, RD, LD, CNSC  Phone: [phone]  [email or web address]

## 2018-12-06 NOTE — Anesthesia Pre-Procedure Evaluation
Anesthesia Pre-Procedure Evaluation    Name: Evan Stevens      MRN: [id number]     DOB: Dec 17, 1940     Age: 78 y.o.     Sex: male   _________________________________________________________________________     Procedure Info:   Procedure Information     Date/Time:  12/06/18 1610    Procedures:       EGD (N/A )      POEM (N/A ) - POEM    Location:  ENDO 1 / ENDO/GI    Surgeon:  Vertell Novak, MD          Physical Assessment  Vital Signs (last filed in past 24 hours):  BP: 137/76 (08/04 1515)  Temp: 36.5 ???C (97.7 ???F) (08/04 1515)  Pulse: 77 (08/04 1515)  Respirations: 15 PER MINUTE (08/04 1515)  SpO2: 98 % (08/04 1515)  Height: 180.3 cm (71) (08/04 1300)  Weight: 57.1 kg (125 lb 12.8 oz) (08/04 1300)      Patient History   No Known Allergies     Current Medications    Medication Directions   ALPRAZolam (XANAX) 0.5 mg tablet Take 0.5 mg by mouth.   aspirin EC 81 mg tablet Take 81 mg by mouth daily.   calcium carbonate/vitamin D-3 (OSCAL-500+D) 1250 mg/200 unit tablet Take 1 tablet by mouth daily.   famotidine (PEPCID) 20 mg tablet    gabapentin (NEURONTIN) 300 mg capsule Take 300 mg by mouth.   HYDROcodone/acetaminophen (NORCO) 5/325 mg tablet Take 1 tablet by mouth   lamoTRIgine (LAMICTAL) 200 mg tablet Take 200 mg by mouth daily.   ondansetron (ZOFRAN ODT) 4 mg rapid dissolve tablet DISSOLVE 1 TABLET IN MOUTH EVERY 6 HOURS AS NEEDED FOR NAUSEA AND VOMITING   Pantoprazole 40 mg grps    sertraline (ZOLOFT) 50 mg tablet Take 50 mg by mouth daily.   sucralfate (CARAFATE) 1 gram tablet Take 1 g by mouth three times daily before meals.   tamsulosin (FLOMAX) 0.4 mg capsule Take 0.4 mg by mouth daily.         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 No limitations to neck range of motion      Pulmonary           Pneumonia (History of pneumonia with empyema (03/2018). S/P chest tube placement. No recent respiratory issues )      COVID NEGATIVE (10/04/2018)      Cardiovascular         Exercise tolerance: >4 METS      Beta Blocker therapy: No        Hypertension, poorly controlled      Dyspnea on exertion      GI/Hepatic/Renal           GERD, well controlled      No nausea      No vomiting      Documented dysphagia, esophageal stricture with dysmotility. Pt reports 17lb wt loss since 07/03/2018 due to difficulties swallowing food causing him to vomit.    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 Reports last seizure was years ago), well controlled      Musculoskeletal         Arthritis (bilateral wrists)      Endocrine/Other - negative      Constitution - negative   Physical Exam    Airway Findings      Mallampati: II      TM distance: >3 FB      Neck ROM: full      Mouth opening: good      Airway patency: adequate    Dental Findings:       Upper dentures    Cardiovascular Findings: Negative      Rhythm: regular      Rate: normal    Pulmonary Findings:    Decreased breath sounds.    Abdominal Findings: Negative      Neurological Findings: Negative      Alert and oriented x 3    Constitutional findings: Negative      No acute distress       Diagnostic Tests  Hematology:   Lab Results   Component Value Date    HGB 14.1 12/06/2018    HCT 41.2 12/06/2018    PLTCT 204 12/06/2018    WBC 6.0 12/06/2018    NEUT 44 12/05/2018    ANC 2.88 12/05/2018    ALC 2.51 12/05/2018    MONA 7 12/05/2018    AMC 0.43 12/05/2018    EOSA 10 12/05/2018    ABC 0.08 12/05/2018    MCV 93.6 12/06/2018    MCH 32.1 12/06/2018    MCHC 34.3 12/06/2018    MPV 7.5 12/06/2018    RDW 13.6 12/06/2018 General Chemistry:   Lab Results   Component Value Date    NA 139 12/06/2018    K 4.0 12/06/2018    CL 106 12/06/2018    CO2 24 12/06/2018    GAP 9 12/06/2018    BUN 15 12/06/2018    CR 1.18 12/06/2018    GLU 78 12/06/2018    CA 8.9 12/06/2018    ALBUMIN 4.0 12/05/2018    TOTBILI 0.6 12/05/2018      Coagulation:   Lab Results   Component Value Date    INR 1.1 09/14/2018         Anesthesia Plan    ASA score: 3   Plan: general  Induction method: intravenous  NPO status: acceptable      Informed Consent  Anesthetic plan and risks discussed with patient.  Use of blood products discussed with patient  Blood Consent: consented      Plan discussed with: anesthesiologist, CRNA and surgeon/proceduralist.  Comments: (Pre-Anesthesia Evaluation Attestation:  I have reviewed key portions of the anesthesia pre-procedure evaluation, and have examined the patient's airway, heart, and lungs and agree with what is documented.   The anesthesia plan is  General  and ASA Classification: ASA III    Staff name:  Brayton Layman, MD Date:  12/06/2018    )

## 2018-12-06 NOTE — Progress Notes
Patient arrived to room # [phone]) via bed accompanied by transport. Patient transferred to the bed without assistance. Bedside safety checks completed. Initial patient assessment completed. Refer to flowsheet for details.    Admission skin assessment completed with: Megan,RN    Pressure injury present on arrival?: No    1. Head/Face/Neck: No  2. Trunk/Back: No  3. Upper Extremities: No  4. Lower Extremities: No  5. Pelvic/Coccyx: No  6. Assessed for device associated injury? Yes  7. Malnutrition Screening Tool (Nursing Nutrition Assessment) Completed? Yes    See Doc Flowsheet for additional wound details.     INTERVENTIONS:

## 2018-12-06 NOTE — Care Coordination-Inpatient
For any questions prior to 8am please call Team  Med Private Swing 6, pager [phone]. After 8am contact Team  Med Private J, pager [phone]

## 2018-12-06 NOTE — Progress Notes
RT Adult Assessment Note    NAME:Evan Stevens             MRN: [id number]             DOB:15-Feb-1941          AGE: 78 y.o.  ADMISSION DATE: 12/05/2018             DAYS ADMITTED: LOS: 1 day    RT Treatment Plan:  Protocol Plan: Medications  Albuterol: (QDAY AT BEDTIME)         Additional Comments:  Impressions of the patient: Patient resting in bed peacefully. Inspiratory wheezes were heard during this time. Patient shared he takes albuterol at home before bed for wheezing.  Intervention(s)/outcome(s): Albuterol MDI is ordered for 2 puffs at bedtime - home reg  Patient education that was completed: None  Recommendations to the care team: Follow RT treatment plan stated above    Vital Signs:  Pulse: 71  RR: 18 PER MINUTE  SpO2: 92 %  O2 Device:    Liter Flow:    O2%:    Breath Sounds: Inspiratory wheezes;Decreased  Respiratory Effort: Non-Labored

## 2018-12-07 ENCOUNTER — Encounter: Admit: 2018-12-07 | Discharge: 2018-12-07

## 2018-12-07 DIAGNOSIS — K759 Inflammatory liver disease, unspecified: Secondary | ICD-10-CM

## 2018-12-07 DIAGNOSIS — R569 Unspecified convulsions: Secondary | ICD-10-CM

## 2018-12-07 DIAGNOSIS — G4700 Insomnia, unspecified: Secondary | ICD-10-CM

## 2018-12-07 DIAGNOSIS — K929 Disease of digestive system, unspecified: Secondary | ICD-10-CM

## 2018-12-07 DIAGNOSIS — K220 Achalasia of cardia: Secondary | ICD-10-CM

## 2018-12-07 LAB — COVID-19 (SARS-COV-2) PCR

## 2018-12-07 LAB — CBC: Lab: 8.9 K/UL — ABNORMAL LOW (ref 4.5–11.0)

## 2018-12-07 LAB — BASIC METABOLIC PANEL: Lab: 138 MMOL/L — ABNORMAL LOW (ref 137–147)

## 2018-12-07 MED ORDER — MORPHINE 2 MG/ML IV SYRG
1-2 mg | INTRAVENOUS | 0 refills | Status: DC | PRN
Start: 2018-12-07 — End: 2018-12-08
  Administered 2018-12-07: 14:00:00 2 mg via INTRAVENOUS

## 2018-12-07 MED ORDER — BISACODYL 10 MG RE SUPP
10 mg | Freq: Every day | RECTAL | 0 refills | Status: DC | PRN
Start: 2018-12-07 — End: 2018-12-09

## 2018-12-07 MED ORDER — DEXTROSE 5%-0.45% SODIUM CHLORIDE IV SOLP
INTRAVENOUS | 0 refills | Status: AC
Start: 2018-12-07 — End: ?
  Administered 2018-12-07 – 2018-12-09 (×3): 1000.000 mL via INTRAVENOUS

## 2018-12-07 NOTE — Anesthesia Post-Procedure Evaluation
Post-Anesthesia Evaluation    Name: Evan Stevens      MRN: [id number]     DOB: 08-May-1940     Age: 78 y.o.     Sex: male   __________________________________________________________________________     Procedure Information     Anesthesia Start Date/Time:  12/06/18 1613    Procedures:       EGD (N/A )      POEM (N/A ) - POEM    Location:  ENDO 1 / ENDO/GI    Surgeon:  Threasa Alpha, MD          Post-Anesthesia Vitals  BP: 111/80 (08/04 1830)  Pulse: 69 (08/04 1830)  Respirations: 19 PER MINUTE (08/04 1830)  SpO2: 97 % (08/04 1830)  SpO2 Pulse: 69 (08/04 1830)   Vitals Value Taken Time   BP 111/80 12/06/2018  6:30 PM   Temp     Pulse 69 12/06/2018  6:30 PM   Respirations 19 PER MINUTE 12/06/2018  6:30 PM   SpO2 97 % 12/06/2018  6:30 PM         Post Anesthesia Evaluation Note    Evaluation location: pre/post  Patient participation: recovered; patient participated in evaluation  Level of consciousness: alert    Pain score: 3  Pain management: adequate    Hydration: normovolemia  Temperature: 36.0C - 38.4C  Airway patency: adequate    Perioperative Events       Post-op nausea and vomiting: no PONV    Postoperative Status  Cardiovascular status: hemodynamically stable  Respiratory status: spontaneous ventilation        Perioperative Events  Perioperative Event: No  Emergency Case Activation: No

## 2018-12-08 ENCOUNTER — Encounter: Admit: 2018-12-08 | Discharge: 2018-12-08

## 2018-12-08 LAB — CBC
Lab: 4.2 M/UL — ABNORMAL LOW (ref 4.4–5.5)
Lab: 6.4 10*3/uL — ABNORMAL LOW (ref 4.5–11.0)

## 2018-12-08 LAB — BASIC METABOLIC PANEL
Lab: 139 MMOL/L — ABNORMAL LOW (ref >60)
Lab: 3.7 MMOL/L — ABNORMAL LOW (ref >60)

## 2018-12-08 MED ORDER — SODIUM CHLORIDE 0.9 % IV SOLP
INTRAVENOUS | 0 refills | Status: CN
Start: 2018-12-08 — End: ?

## 2018-12-08 NOTE — Care Plan
Problem: Discharge Planning  Goal: Participation in plan of care  Outcome: Goal Ongoing  Flowsheets (Taken 12/07/2018 1934)  Participation in Plan of Care: Involve patient/caregiver in care planning decision making  Note: Patient involved in care plan and decision making.   Goal: Knowledge regarding plan of care  Outcome: Goal Ongoing  Flowsheets (Taken 12/07/2018 1934)  Knowledge regarding plan of care:   Provide plan of care education   Provide procedural and treatment education   Provide fall prevention education   Provide medication management education  Note: Medication education and plan of care education provided.  Goal: Prepared for discharge  Outcome: Goal Ongoing  Flowsheets (Taken 12/07/2018 1934)  Prepared for discharge:   Complete ADL ability assessment   Collaborate with multidisciplinary team for hospital discharge coordination  Note: Plan for discharge this weekend if tolerating diet and pending gastric emptying study.      Problem: Nutrition Deficit  Goal: Adequate nutritional intake  Outcome: Goal Ongoing  Note: Patient currently strict NPO due to recent POEM procedure.   Goal: Body weight within specified parameters  Outcome: Goal Ongoing     Problem: Falls, High Risk of  Goal: Absence of falls-Adult Patient  Outcome: Goal Ongoing

## 2018-12-08 NOTE — Progress Notes
General Progress Note    Name:  Evan Stevens   Today's Date:  12/08/2018  Admission Date: 12/05/2018  LOS: 3 days                     Assessment/Plan:    Principal Problem:    Achalasia  Active Problems:    Seizure disorder (HCC)    Anxiety and depression    Benign prostatic hyperplasia with lower urinary tract symptoms    Unintentional weight loss    Severe malnutrition (HCC)    78yoM w/ PMH of seizure d/o, MDD and GAD, achalasia who is admitted for POEM procedure.    Changes made to today's assessment and plan are indicated in bold.    Achalasia  - 30-35lb weight loss over the past one year  - GI consulted  - s/p POEM on 8/4  - cont strict NPO  - cont Zosyn  - plan for repeat EGD tomorrow    MDD, GAD  - cont zoloft, PRN ativan    BPH  - cont flomax    Seizure d/o  - cont lamictal, gabapentin    Malnutrition Details:  ICD-10 code E43: Acute illness/Severe malnutrition    Weight loss: > 7.5% x 3 months, Energy intake: < 50% of estimated energy requirement for 5 days or more      Loss of Subcutaneous Fat: Yes Moderate Triceps  Muscle Wasting: Yes Moderate Clavicle  Edema: No      Malnutrition Interventions: Provided nutrition education, assessed adequacy/tolerance of PO intake   - RD following    FEN: no IVF, replace lytes PRN, NPO  Ppx: SCD's only  Code: [id number]    Dispo: cont inpt admit, doing well post-procedure.  Cont strict NPO.  Cont zosyn; plan for repeat EGD tomorrow.  ________________________________________________________________________    Subjective  Evan Stevens is a 78 y.o. male.  Patient w/o acute events overnight.  Still having some gastric pain, he thinks it's just hunger pains.  Still hasn't had a bowel movement since past Sunday, reports he's passing some gas, but is minimal.    ROS: neg for fevers, chills, dyspnea, chest pain, palpitations     Medications  Scheduled Meds:albuterol sulfate (PROAIR HFA) inhaler 2 puff, 2 puff, Inhalation, QHS calcium carbonate/vitamin D-3 (OSCAL-500+D) 1250 mg/200 unit tablet 1 tablet, 1 tablet, Oral, QDAY  famotidine (PEPCID) tablet 20 mg, 20 mg, Oral, QDAY  gabapentin (NEURONTIN) capsule 300 mg, 300 mg, Oral, QDAY  lamoTRIgine (LaMICtal) tablet 200 mg, 200 mg, Oral, QDAY  piperacillin/tazobactam (ZOSYN) 3.375 g in sodium chloride 0.9% (NS) 100 mL IVPB (MB+), 3.375 g, Intravenous, Q6H*  sertraline (ZOLOFT) tablet 50 mg, 50 mg, Oral, QDAY  sucralfate (CARAFATE) tablet 1 g, 1 g, Oral, TID before meals  tamsulosin (FLOMAX) capsule 0.4 mg, 0.4 mg, Oral, QDAY    Continuous Infusions:  ??? dextrose  5 % & 0.45% NaCl infusion 75 mL/hr at 12/08/18 0626   ??? lactated ringers infusion       PRN and Respiratory Meds:bisacodyL QDAY PRN, LORazepam  (ATIVAN)  injection Q6H PRN, melatonin QHS PRN, ondansetron Q6H PRN      Objective                       Vital Signs: Last Filed                 Vital Signs: 24 Hour Range   BP: 152/64 (08/06 0914)  Temp: 36.6 ???C (97.9 ???F) (08/06  0865)  Pulse: 51 (08/06 0914)  Respirations: 18 PER MINUTE (08/06 0914)  SpO2: 97 % (08/06 0914) BP: (100-152)/(53-82)   Temp:  [36.6 ???C (97.9 ???F)-36.9 ???C (98.4 ???F)]   Pulse:  [51-84]   Respirations:  [16 PER MINUTE-18 PER MINUTE]   SpO2:  [93 %-97 %]    Intensity Pain Scale (Self Report): 5 (12/07/18 2055) Vitals:    12/05/18 1825 12/06/18 1300   Weight: 57.1 kg (125 lb 12.8 oz) 57.1 kg (125 lb 12.8 oz)       Intake/Output Summary:  (Last 24 hours)    Intake/Output Summary (Last 24 hours) at 12/08/2018 1044  Last data filed at 12/08/2018 0931  Gross per 24 hour   Intake 917 ml   Output 0 ml   Net 917 ml      Stool Occurrence: 0    Physical Exam  General:  Alert, cooperative, no distress, appears stated age.  Thin appearing.  Psych: normal mood and affect  Head:  Normocephalic, without obvious abnormality, atraumatic.  Neck: - JVD  Eyes:  Conjunctivae/corneas clear.   Lungs:  CTAB, no rales, no wheezing.  Heart:    Regular rate and rhythm, no MRG. Abdomen:  Soft, non-tender.  Bowel sounds hypoactive.    Extremities:  No cyanosis, clubbing, nor edema.  Pulses:   2+ radial pulses b/l  Skin:   Skin color, texture, turgor normal.  No rashes or lesions.    Lab Review  Hematology:    Lab Results   Component Value Date    HGB 13.6 12/08/2018    HCT 39.9 12/08/2018    PLTCT 177 12/08/2018    WBC 6.4 12/08/2018    NEUT 44 12/05/2018    ANC 2.88 12/05/2018    ALC 2.51 12/05/2018    MONA 7 12/05/2018    AMC 0.43 12/05/2018    ABC 0.08 12/05/2018    MCV 93.7 12/08/2018    MCHC 34.0 12/08/2018    MPV 7.3 12/08/2018    RDW 13.5 12/08/2018   , Coagulation:    Lab Results   Component Value Date    INR 1.1 09/14/2018   , General Chemistry:    Lab Results   Component Value Date    NA 139 12/08/2018    K 3.7 12/08/2018    CL 105 12/08/2018    GAP 13 12/08/2018    BUN 12 12/08/2018    CR 1.33 12/08/2018    GLU 99 12/08/2018    CA 8.6 12/08/2018    ALBUMIN 4.0 12/05/2018    TOTBILI 0.6 12/05/2018    and Enzymes:    Lab Results   Component Value Date    AST 16 12/05/2018    ALT 11 12/05/2018    ALKPHOS 95 12/05/2018       Point of Care Testing  (Last 24 hours)  Glucose: 99 (12/08/18 0704)    Radiology and other Diagnostics Review:    No radiology.    Georgiana Shore, MD, FACP  Clinical Assistant Professor  Internal Medicine, Hospital Medicine  Pager:  [phone]

## 2018-12-08 NOTE — Case Management (ED)
Case Management Progress Note    NAME:Evan Stevens                          MRN: [id number]              DOB:1940-09-06          AGE: 78 y.o.  ADMISSION DATE: 12/05/2018             DAYS ADMITTED: LOS: 3 days      Todays Date: 12/08/2018    Plan  Home when medically stable and able to take PO, EDD 12/09/18   Pt does not have drug coverage and either gets his meds at the [facility] in [place], [place] or at [facility].     Pt's daughter Juliann Pulse will provide transportation home   He would like to fill any Rx's at [facility] in [place]:  EGD in am, continue IVF's, pain control    Interventions  ? Support      ? Info or Referral      ? Discharge Planning   Discharge Planning: No Needs Identified    Reviewed EMR,  attended huddle  ? Medication Needs      ?  Financial      ? Legal      ? Other        Disposition  ? Expected Discharge Date    Expected Discharge Date: 12/09/18  ? Transportation   Does the patient need discharge transport arranged?: No  Transportation Name, Phone and Availability #1: daughter Juliann Pulse to provide transportation home  Does the patient use Medicaid Transportation?: No  ? Next Level of Care (Acute Psych discharges only)      ? Discharge Disposition       Durable Medical Equipment      No service has been selected for the patient.      [facility]      No service has been selected for the patient.      [place]      No service has been selected for the patient.      [facility]      No service has been selected for the patient.        Bobette Mo, BSN, RN, [place] Nurse Case Manager  Ph [phone]  Pg [phone]

## 2018-12-08 NOTE — Care Plan
Problem: Discharge Planning  Goal: Participation in plan of care  Outcome: Goal Ongoing  Goal: Knowledge regarding plan of care  Outcome: Goal Ongoing  Goal: Prepared for discharge  Outcome: Goal Ongoing     Problem: Nutrition Deficit  Goal: Adequate nutritional intake  Outcome: Goal Ongoing  Goal: Body weight within specified parameters  Outcome: Goal Ongoing     Problem: Falls, High Risk of  Goal: Absence of falls-Adult Patient  Outcome: Goal Ongoing     Problem: Falls, High Risk of  Goal: Absence of Falls-Pediatric patient  Outcome: Goal Achieved

## 2018-12-09 ENCOUNTER — Encounter: Admit: 2018-12-09 | Discharge: 2018-12-09

## 2018-12-09 ENCOUNTER — Encounter: Admit: 2018-12-06 | Discharge: 2018-12-06

## 2018-12-09 DIAGNOSIS — N401 Enlarged prostate with lower urinary tract symptoms: Secondary | ICD-10-CM

## 2018-12-09 DIAGNOSIS — F329 Major depressive disorder, single episode, unspecified: Secondary | ICD-10-CM

## 2018-12-09 DIAGNOSIS — Z87891 Personal history of nicotine dependence: Secondary | ICD-10-CM

## 2018-12-09 DIAGNOSIS — F419 Anxiety disorder, unspecified: Secondary | ICD-10-CM

## 2018-12-09 DIAGNOSIS — Z66 Do not resuscitate: Secondary | ICD-10-CM

## 2018-12-09 DIAGNOSIS — K219 Gastro-esophageal reflux disease without esophagitis: Secondary | ICD-10-CM

## 2018-12-09 DIAGNOSIS — Z1159 Encounter for screening for other viral diseases: Secondary | ICD-10-CM

## 2018-12-09 DIAGNOSIS — E43 Unspecified severe protein-calorie malnutrition: Secondary | ICD-10-CM

## 2018-12-09 DIAGNOSIS — G4700 Insomnia, unspecified: Secondary | ICD-10-CM

## 2018-12-09 DIAGNOSIS — K220 Achalasia of cardia: Principal | ICD-10-CM

## 2018-12-09 DIAGNOSIS — F411 Generalized anxiety disorder: Secondary | ICD-10-CM

## 2018-12-09 DIAGNOSIS — G40909 Epilepsy, unspecified, not intractable, without status epilepticus: Secondary | ICD-10-CM

## 2018-12-09 DIAGNOSIS — R569 Unspecified convulsions: Secondary | ICD-10-CM

## 2018-12-09 DIAGNOSIS — K929 Disease of digestive system, unspecified: Secondary | ICD-10-CM

## 2018-12-09 DIAGNOSIS — K759 Inflammatory liver disease, unspecified: Secondary | ICD-10-CM

## 2018-12-09 LAB — BASIC METABOLIC PANEL: Lab: 139 MMOL/L (ref >60)

## 2018-12-09 LAB — CBC: Lab: 5.7 K/UL — ABNORMAL HIGH (ref 4.5–11.0)

## 2018-12-09 MED ORDER — PROPOFOL 10 MG/ML IV EMUL 20 ML (INFUSION)(AM)(OR)
INTRAVENOUS | 0 refills | Status: DC
Start: 2018-12-09 — End: 2018-12-09
  Administered 2018-12-09: 16:00:00 100 ug/kg/min via INTRAVENOUS

## 2018-12-09 MED ORDER — PIPERACILLIN/TAZOBACTAM 3.375 G/NS IVPB (MB+)
0 refills | Status: DC
Start: 2018-12-09 — End: 2018-12-09
  Administered 2018-12-09 (×2): 3.375 g via INTRAVENOUS

## 2018-12-09 MED ORDER — PROPOFOL INJ 10 MG/ML IV VIAL
0 refills | Status: DC
Start: 2018-12-09 — End: 2018-12-09
  Administered 2018-12-09: 16:00:00 30 mg via INTRAVENOUS
  Administered 2018-12-09: 16:00:00 40 mg via INTRAVENOUS

## 2018-12-09 MED ORDER — LACTATED RINGERS IV SOLP
1000 mL | INTRAVENOUS | 0 refills | Status: DC
Start: 2018-12-09 — End: 2018-12-09
  Administered 2018-12-09: 14:00:00 1000 mL via INTRAVENOUS

## 2018-12-09 MED ORDER — LIDOCAINE (PF) 200 MG/10 ML (2 %) IJ SYRG
0 refills | Status: DC
Start: 2018-12-09 — End: 2018-12-09
  Administered 2018-12-09: 16:00:00 60 mg via INTRAVENOUS

## 2018-12-09 MED ORDER — PROMETHAZINE 25 MG/ML IJ SOLN
6.25 mg | INTRAVENOUS | 0 refills | Status: CN | PRN
Start: 2018-12-09 — End: ?

## 2018-12-09 MED ORDER — FENTANYL CITRATE (PF) 50 MCG/ML IJ SOLN
50 ug | INTRAVENOUS | 0 refills | Status: CN | PRN
Start: 2018-12-09 — End: ?

## 2018-12-09 NOTE — Progress Notes
Day of Discharge Progress Note    S:   Pt w/o acute events overnight.  Seen after EGD procedure.  He denies pain.  He is hoping to go home today.    O:   Exam unchanged from day prior.    Vitals:    12/09/18 0900 12/09/18 1127 12/09/18 1130 12/09/18 1135   BP: (!) 173/80 (!) 167/55 131/54 123/73   BP Source:  Arm, Right Upper     Pulse: 49 56 55 50   Temp: 36.6 C (97.9 F) 36.8 C (98.2 F)     SpO2: 97% 100% 99% 97%   Weight:       Height:         Labs reviewed.    A/P:  59yoM w/ PMH of seizure d/o, MDD and GAD, achalasia who is admitted for POEM procedure.    Changes made to today's assessment and plan are indicated in bold.    Achalasia  - 30-35lb weight loss over the past one year  - GI consulted  - s/p POEM on 8/4  - repeat EGD today looks good  - discussed w/ GI; ok to start liquid diet x 3 days, soft diet x 3 days then resume regular diet    MDD, GAD  - cont zoloft, PRN ativan    BPH  - cont flomax    Seizure d/o  - cont lamictal, gabapentin    Malnutrition Details:  ICD-10 code E43: Acute illness/Severe malnutrition    Weight loss: > 7.5% x 3 months, Energy intake: < 50% of estimated energy requirement for 5 days or more      Loss of Subcutaneous Fat: Yes Moderate Triceps  Muscle Wasting: Yes Moderate Clavicle  Edema: No      Malnutrition Interventions: Provided nutrition education, assessed adequacy/tolerance of PO intake       <30 min spent in discharge planning including counseling on medications, arranging follow up appointments, and discussing return precautions.        Francine Graven, MD  Pager:  [phone]    No future appointments.

## 2018-12-09 NOTE — Anesthesia Pre-Procedure Evaluation
Anesthesia Pre-Procedure Evaluation    Name: Evan Stevens      MRN: [id number]     DOB: [birth year]     Age: 78 y.o.     Sex: male   _________________________________________________________________________     Procedure Info:   Procedure Information     Date/Time:  12/09/18 1100    Procedure:  ESOPHAGOGASTRODUODENOSCOPY WITH BIOPSY - FLEXIBLE (N/A )    Location:  ENDO 1 / ENDO/GI    Surgeon:  Vertell Novak, MD          Physical Assessment  Vital Signs (last filed in past 24 hours):  BP: 162/68 (08/07 0527)  Temp: 36.6 ???C (97.8 ???F) (08/07 2595)  Pulse: 44 (08/07 0527)  Respirations: 16 PER MINUTE (08/07 0527)  SpO2: 96 % (08/07 0527)      Patient History   No Known Allergies     Current Medications    Medication Directions   ALPRAZolam (XANAX) 0.5 mg tablet Take 0.5 mg by mouth.   aspirin EC 81 mg tablet Take 81 mg by mouth daily.   calcium carbonate/vitamin D-3 (OSCAL-500+D) 1250 mg/200 unit tablet Take 1 tablet by mouth daily.   famotidine (PEPCID) 20 mg tablet    gabapentin (NEURONTIN) 300 mg capsule Take 300 mg by mouth.   HYDROcodone/acetaminophen (NORCO) 5/325 mg tablet Take 1 tablet by mouth   lamoTRIgine (LAMICTAL) 200 mg tablet Take 200 mg by mouth daily.   ondansetron (ZOFRAN ODT) 4 mg rapid dissolve tablet DISSOLVE 1 TABLET IN MOUTH EVERY 6 HOURS AS NEEDED FOR NAUSEA AND VOMITING   Pantoprazole 40 mg grps    sertraline (ZOLOFT) 50 mg tablet Take 50 mg by mouth daily.   sucralfate (CARAFATE) 1 gram tablet Take 1 g by mouth three times daily before meals.   tamsulosin (FLOMAX) 0.4 mg capsule Take 0.4 mg by mouth daily.         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 Pt reports he has had this for several years and got it removed and it has since grown back. No limitations to neck range of motion      Pulmonary           Pneumonia (History of pneumonia with empyema (03/2018). S/P chest tube placement. No recent respiratory issues )      COVID NEGATIVE (10/04/2018)      Cardiovascular         Exercise tolerance: >4 METS      Beta Blocker therapy: No        Hypertension, poorly controlled      Dyspnea on exertion      GI/Hepatic/Renal           GERD, well controlled      No nausea      No vomiting      Documented dysphagia, esophageal stricture with dysmotility. Pt reports 17lb wt loss since 07/03/2018 due to difficulties swallowing food causing him to vomit.    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 Reports last seizure was years ago), well controlled  Musculoskeletal         Arthritis (bilateral wrists)      Endocrine/Other - negative      Constitution - negative   Physical Exam    Airway Findings      Mallampati: II      TM distance: >3 FB      Neck ROM: full      Mouth opening: good      Airway patency: adequate    Dental Findings:       Upper dentures    Cardiovascular Findings: Negative      Rhythm: regular      Rate: normal    Pulmonary Findings:    Decreased breath sounds.    Abdominal Findings: Negative      Neurological Findings: Negative      Alert and oriented x 3    Constitutional findings: Negative      No acute distress       Diagnostic Tests  Hematology:   Lab Results   Component Value Date    HGB 13.6 12/08/2018    HCT 39.9 12/08/2018    PLTCT 177 12/08/2018    WBC 6.4 12/08/2018    NEUT 44 12/05/2018    ANC 2.88 12/05/2018    ALC 2.51 12/05/2018    MONA 7 12/05/2018    AMC 0.43 12/05/2018    EOSA 10 12/05/2018    ABC 0.08 12/05/2018    MCV 93.7 12/08/2018    MCH 31.8 12/08/2018    MCHC 34.0 12/08/2018    MPV 7.3 12/08/2018    RDW 13.5 12/08/2018         General Chemistry:   Lab Results   Component Value Date    NA 139 12/08/2018    K 3.7 12/08/2018    CL 105 12/08/2018 CO2 21 12/08/2018    GAP 13 12/08/2018    BUN 12 12/08/2018    CR 1.33 12/08/2018    GLU 99 12/08/2018    CA 8.6 12/08/2018    ALBUMIN 4.0 12/05/2018    TOTBILI 0.6 12/05/2018      Coagulation:   Lab Results   Component Value Date    INR 1.1 09/14/2018         Anesthesia Plan    ASA score: 3   Plan: MAC  Induction method: intravenous  NPO status: acceptable      Informed Consent  Anesthetic plan and risks discussed with patient.  Use of blood products discussed with patient  Blood Consent: consented      Plan discussed with: anesthesiologist, CRNA and surgeon/proceduralist.  Comments: (        )

## 2018-12-09 NOTE — Progress Notes
Patient Blood pressure, 162/68, HR 44. MPJ notified, no orders at this time. Will continue to monitor the pt.

## 2018-12-09 NOTE — Progress Notes
EGD/Upper EUS/ERCP/Antegrade Enteroscopy  Post Upper Endoscopy Instructions      -You may have a sore throat after the procedure for 2-3 days.  Try sucrets or lozenges to help ease the pain.  If it continues please contact us.    -If you feel feverish, have a temperature of 101 degrees or higher, persistent nausea and vomiting, abdominal pain or dark stools; please notify your nurse or GI physician.    -You may have abdominal cramping following the procedure this can be relieved by belching or passing air.    -If you have redness or swelling at the IV site, place a warm, wet washcloth over the affected areas for 15 minutes, 3-4 times a day until the redness subsides.  If symptoms continue for 2-3 days, contact your regular physician.    - If you have bleeding from your mouth, over 2 tablespoons and increasing, please notify your physician.  A small amount of bleeding is normal if a biopsy or polyps were taken.  If you are vomiting blood you need to seek immediate medical attention.    - You may resume all your routine medications, if medications need to be held your physician and/or nurse will notify you post procedure.    SPECIFIC INSTRUCTIONS  INPATIENTS:  Ask for help when you get up in your room, as you may still be drowsy from your sedation.    OUTPATIENTS:  A. Because of sedation and lack of coordination, FOR THE NEXT 24 HOURS, DO NOT:  1. Operate any motorized vehicle - this includes driving.  2. Sign any legal documents or conduct important business matters.  3. Use any dangerous machinery (chain saw, lawnmower, etc.).  4. Drink any alcoholic beverages.  Should you have any questions or concerns after your procedure please call [phone] M-F 8am-5:00 pm. After 5:00 pm, holidays or weekends call [phone] and ask for the GI Doctor on call.

## 2018-12-09 NOTE — Care Plan
Problem: Discharge Planning  Goal: Participation in plan of care  Outcome: Goal Achieved  Goal: Knowledge regarding plan of care  Outcome: Goal Achieved  Goal: Prepared for discharge  Outcome: Goal Achieved     Problem: Nutrition Deficit  Goal: Adequate nutritional intake  Outcome: Goal Achieved  Goal: Body weight within specified parameters  Outcome: Goal Achieved     Problem: Falls, High Risk of  Goal: Absence of falls-Adult Patient  Outcome: Goal Achieved

## 2018-12-09 NOTE — Discharge Planning (AHS/AVS)
EGD/Upper EUS/ERCP/Antegrade Enteroscopy  Post Upper Endoscopy Instructions      -You may have a sore throat after the procedure for 2-3 days.  Try sucrets or lozenges to help ease the pain.  If it continues please contact us.    -If you feel feverish, have a temperature of 101 degrees or higher, persistent nausea and vomiting, abdominal pain or dark stools; please notify your nurse or GI physician.    -You may have abdominal cramping following the procedure this can be relieved by belching or passing air.    -If you have redness or swelling at the IV site, place a warm, wet washcloth over the affected areas for 15 minutes, 3-4 times a day until the redness subsides.  If symptoms continue for 2-3 days, contact your regular physician.    - If you have bleeding from your mouth, over 2 tablespoons and increasing, please notify your physician.  A small amount of bleeding is normal if a biopsy or polyps were taken.  If you are vomiting blood you need to seek immediate medical attention.    - You may resume all your routine medications, if medications need to be held your physician and/or nurse will notify you post procedure.    SPECIFIC INSTRUCTIONS  INPATIENTS:  Ask for help when you get up in your room, as you may still be drowsy from your sedation.    OUTPATIENTS:  A. Because of sedation and lack of coordination, FOR THE NEXT 24 HOURS, DO NOT:  1. Operate any motorized vehicle - this includes driving.  2. Sign any legal documents or conduct important business matters.  3. Use any dangerous machinery (chain saw, lawnmower, etc.).  4. Drink any alcoholic beverages.  Should you have any questions or concerns after your procedure please call [phone] M-F 8am-5:00 pm. After 5:00 pm, holidays or weekends call [phone] and ask for the GI Doctor on call.

## 2018-12-09 NOTE — Anesthesia Post-Procedure Evaluation
Post-Anesthesia Evaluation    Name: Evan Stevens      MRN: [id number]     DOB: January 02, 1941     Age: 78 y.o.     Sex: male   __________________________________________________________________________     Procedure Information     Anesthesia Start Date/Time:  12/09/18 1117    Procedure:  ESOPHAGOGASTRODUODENOSCOPY WITH BIOPSY - FLEXIBLE (N/A )    Location:  ENDO 1 / ENDO/GI    Surgeon:  Threasa Alpha, MD          Post-Anesthesia Vitals  BP: 123/73 (08/07 1135)  Temp: 36.8 C (98.2 F) (08/07 1127)  Pulse: 50 (08/07 1135)  Respirations: 20 PER MINUTE (08/07 1135)  SpO2: 97 % (08/07 1135)  SpO2 Pulse: 50 (08/07 1135)   Vitals Value Taken Time   BP 123/73 12/09/2018 11:35 AM   Temp     Pulse 50 12/09/2018 11:35 AM   Respirations 20 PER MINUTE 12/09/2018 11:35 AM   SpO2 97 % 12/09/2018 11:35 AM         Post Anesthesia Evaluation Note    Evaluation location: other (GI)  Patient participation: recovered; patient participated in evaluation  Level of consciousness: alert    Pain score: 0  Pain management: adequate    Hydration: normovolemia  Temperature: 36.0C - 38.4C  Airway patency: adequate    Perioperative Events       Post-op nausea and vomiting: no PONV    Postoperative Status  Cardiovascular status: hemodynamically stable  Respiratory status: spontaneous ventilation  Additional comments: Post-Anesthesia Evaluation Attestation: I reviewed and agree the indicated post-anesthesia care was provided. I have reviewed key portions of the indicated post anesthesia care. I have examined the patient's vitals, physical status, and complications and agree with what is documented.    Staff name:  Tacey Heap, MD Date:  12/09/2018          Perioperative Events  Perioperative Event: No

## 2018-12-09 NOTE — Progress Notes
Evan Stevens discharged on 12/09/2018.     Discharge instructions reviewed with patient. All education, questions, and concerns addressed. PIV removed.   Valuables returned: Sent with patient.  Personal Items / Valuables: Cell Phone, Dentures  Denture Type: Full upper  Where Are Valuables Stored?: with pt.  Home medications: n/a     Functional assessment at discharge complete: Yes . Pt taken to vehicle by wheelchair at discharge.

## 2018-12-09 NOTE — H&P (View-Only)
Pre Procedure History and Physical/Sedation Plan    Name:Evan Stevens                                                                   MRN: [id number]                 DOB:August 14, 1940          Age: 78 y.o.  Date of Service: 12/09/2018    Date of Procedure:  12/09/2018    Planned Procedure(s):  GI:  EGD  Sedation/Medication Plan: MAC (Monitored Anesthesia Care)  Discussion/Reviews:  Physician has discussed risks and alternatives of this type of sedation and above planned procedures with patient  ___________________________________________________________________  Chief Complaint:  S/P POEM    History of Present Illness: Evan Stevens is a 78 y.o. male with S/P POEM    Previous Anesthetic/Sedation History:  MAC    Medical History:   Diagnosis Date   ??? Gastrointestinal disorder     GERD   ??? Hepatitis     HX OF LONG TIME AGO.   ??? Insomnia    ??? Seizures (HCC)      Surgical History:   Procedure Laterality Date   ??? BACK SURGERY  1980   ??? EGD w/ Endoflip N/A 10/06/2018    Performed by Jolee Ewing, MD at [facility] ENDO   ??? ESOPHAGEAL BALLOON DISTENSION STUDY WITH/ WITHOUT PROVOCATION - DIAGNOSTIC N/A 10/06/2018    Performed by Jolee Ewing, MD at [facility] ENDO   ??? ESOPHAGOGASTRODUODENOSCOPY WITH BIOPSY - FLEXIBLE N/A 10/06/2018    Performed by Jolee Ewing, MD at [facility] ENDO   ??? ESOPHAGEAL MOTILITY STUDY N/A 10/06/2018    Performed by Jolee Ewing, MD at [facility] ENDO   ??? ESOPHAGOGASTRODUODENOSCOPY WITH ENDOSCOPIC ULTRASOUND EXAMINATION - FLEXIBLE N/A 10/10/2018    Performed by Comer Locket, MD at [facility] ENDO   ??? EGD N/A 12/06/2018    Performed by Vertell Novak, MD at [facility] ENDO   ??? POEM N/A 12/06/2018    Performed by Vertell Novak, MD at [facility] ENDO     Pertinent medical/surgical history reviewed  Pertinent family history reviewed  Social History     Tobacco Use   ??? Smoking status: Never Smoker   ??? Smokeless tobacco: Former Estate agent Use Topics   ??? Alcohol use: Yes   ??? Drug use: Never     Social History     Substance and Sexual Activity Drug Use Never     Allergies:  Patient has no known allergies.  Medications  Current Facility-Administered Medications   Medication   ??? [MAR Hold] albuterol sulfate (PROAIR HFA) inhaler 2 puff   ??? [MAR Hold] bisacodyL (DULCOLAX) rectal suppository 10 mg   ??? [MAR Hold] calcium carbonate/vitamin D-3 (OSCAL-500+D) 1250 mg/200 unit tablet 1 tablet   ??? dextrose  5 % & 0.45% NaCl infusion   ??? [MAR Hold] famotidine (PEPCID) tablet 20 mg   ??? gabapentin (NEURONTIN) capsule 300 mg   ??? lactated ringers infusion   ??? lactated ringers infusion   ??? lamoTRIgine (LaMICtal) tablet 200 mg   ??? [MAR Hold] LORazepam (ATIVAN) injection 0.5 mg   ??? [MAR Hold] melatonin tablet 3 mg   ??? [MAR Hold] ondansetron (ZOFRAN ODT) rapid dissolve tablet  4 mg   ??? piperacillin/tazobactam (ZOSYN) 3.375 g in sodium chloride 0.9% (NS) 100 mL IVPB (MB+)   ??? [MAR Hold] sertraline (ZOLOFT) tablet 50 mg   ??? [MAR Hold] sucralfate (CARAFATE) tablet 1 g   ??? [MAR Hold] tamsulosin (FLOMAX) capsule 0.4 mg     Review of Systems:  All other systems reviewed and are negative.           Physical Exam:  Temp: 36.6 ???C (97.9 ???F) (08/07 0900)  Pulse: 49 (08/07 0900)  Respirations: 15 PER MINUTE (08/07 0900)  BP: 173/80 (08/07 0900)  Airway:  airway assessment performed  Mallampati II (soft palate, uvula, fauces visible)  Anesthesia Classification:  ASA III (A patient with a severe systemic disease that limits activity, but is not incapacitating)  NPO Status: Acceptable  Preganancy Status: N/A    Lab/Radiology/Other Diagnostic Tests  Labs:  Relevant labs reviewed      Kathrynn Ducking, MD  Pager

## 2018-12-09 NOTE — Anesthesia Post-Procedure Evaluation
Post-Anesthesia Evaluation    Name: Evan Stevens      MRN: [id number]     DOB: 06/18/1940     Age: 78 y.o.     Sex: male   __________________________________________________________________________     Procedure Information     Anesthesia Start Date/Time:  12/09/18 1117    Procedure:  ESOPHAGOGASTRODUODENOSCOPY WITH BIOPSY - FLEXIBLE (N/A )    Location:  ENDO 1 / ENDO/GI    Surgeon:  Rastogi, Amit, MD          Post-Anesthesia Vitals  BP: 123/73 (08/07 1135)  Temp: 36.8 C (98.2 F) (08/07 1127)  Pulse: 50 (08/07 1135)  Respirations: 20 PER MINUTE (08/07 1135)  SpO2: 97 % (08/07 1135)  SpO2 Pulse: 50 (08/07 1135)   Vitals Value Taken Time   BP 123/73 12/09/2018 11:35 AM   Temp     Pulse 50 12/09/2018 11:35 AM   Respirations 20 PER MINUTE 12/09/2018 11:35 AM   SpO2 97 % 12/09/2018 11:35 AM         Post Anesthesia Evaluation Note    Evaluation location: other (GI)  Patient participation: recovered; patient participated in evaluation  Level of consciousness: alert    Pain score: 0  Pain management: adequate    Hydration: normovolemia  Temperature: 36.0C - 38.4C  Airway patency: adequate    Perioperative Events       Post-op nausea and vomiting: no PONV    Postoperative Status  Cardiovascular status: hemodynamically stable  Respiratory status: spontaneous ventilation  Additional comments: Post-Anesthesia Evaluation Attestation: I reviewed and agree the indicated post-anesthesia care was provided. I have reviewed key portions of the indicated post anesthesia care. I have examined the patient's vitals, physical status, and complications and agree with what is documented.    Staff name:  [name], MD Date:  12/09/2018          Perioperative Events  Perioperative Event: No

## 2018-12-09 NOTE — Discharge Instructions - Pharmacy
Physician Discharge Summary      Name: Evan Stevens  MRN: [id number]  Date Of Birth: 10-14-1940  Age:  78 years   Admit date:  12/05/2018                     Discharge date:  12/09/2018    Discharge Attending:  Reece Leader, MD  Discharge Summary Completed By: Reece Leader, MD    Service: Med Private [phone]    Reason for hospitalization:  Achalasia [K22.0]  Primary Discharge Diagnosis:   Same as Above  Complete Discharge Diagnoses:  Hospital Problems        Active Problems    * (Principal) Achalasia    Seizure disorder (HCC)    Anxiety and depression    Benign prostatic hyperplasia with lower urinary tract symptoms    Unintentional weight loss    Severe malnutrition (HCC)      Significant Past Medical History        Gastrointestinal disorder      Comment:  GERD  Hepatitis      Comment:  HX OF LONG TIME AGO.  Insomnia  Seizures (HCC)    Allergies   Patient has no known allergies.    Brief Hospital Course   The patient was admitted and the following issues were addressed during this hospitalization: (with pertinent details including admission exam/imaging/labs).      Pt was admitted for planned POEM procedure for history of achalasia and unintentional weight loss.  GI was consulted.  Pt was made NPO and POEM was done day of admit.  Was kept NPO for 3 days on zosyn, fluids, pain control.  F/u EGD on 8/7 was normal.  Ok to resume liquid diet x 3 days, soft diet x 3 days, then regular diet.  No changes were made to his outpt regimen.     Items Needing Follow Up   Pending items or areas that need to be addressed at follow up: None    Medications      Medication List      CONTINUE taking these medications    ??? ALPRAZolam 0.5 mg tablet; Commonly known as: XANAX; Dose: 0.5 mg;   Refills: 0  ??? aspirin EC 81 mg tablet; Dose: 81 mg; Refills: 0  ??? calcium carbonate/vitamin D-3 1250 mg/200 unit tablet; Commonly known   as: OSCAL-500+D; Dose: 1 tablet; Refills: 0  ??? famotidine 20 mg tablet; Commonly known as: PEPCID; Refills: 0 ??? gabapentin 300 mg capsule; Commonly known as: NEURONTIN; Dose: 300 mg;   Refills: 0  ??? HYDROcodone/acetaminophen 5/325 mg tablet; Commonly known as: NORCO;   Dose: 1 tablet; Refills: 0  ??? lamoTRIgine 200 mg tablet; Commonly known as: LaMICtal; Dose: 200 mg;   Refills: 0  ??? ondansetron 4 mg rapid dissolve tablet; Commonly known as: ZOFRAN ODT;   Refills: 0  ??? Pantoprazole 40 mg Grps; Refills: 0  ??? sertraline 50 mg tablet; Commonly known as: ZOLOFT; Dose: 50 mg;   Refills: 0  ??? sucralfate 1 gram tablet; Commonly known as: CARAFATE; Dose: 1 g;   Refills: 0  ??? tamsulosin 0.4 mg capsule; Commonly known as: FLOMAX; Dose: 0.4 mg;   Refills: 0       Return Appointments and Scheduled Appointments     Consults, Procedures, Diagnostics, Micro, Pathology   Consults: GI  Surgical Procedures & Dates: None  Significant Diagnostic Studies, Micro and Procedures: noted in brief hospital course  Significant Pathology: none  Discharge Disposition, Condition   Patient Disposition: Home  Condition at Discharge: Stable    Code Status   Code Status (From admission, onward)     Start     Ordered    12/05/18 1730  DNAR-FI (Do Not Attempt Resuscitation-Full Intervention)    ONGOING     Comments:  He stated he did not want chest compressions or shock   Question Answer Comment   Provider has discussed Code Status w/Patient or Family? Yes    Does the patient want any intervention for a pre-arrest emergency which   would necessitate transfer to an ICU setting? Yes    Respiratory emergency:  does the patient want to have intubation with   mechanical ventilation? Yes    Symptomatic/hypotensive dysrhythmia with a pulse: does the patient want   cardioversion? Yes    Hypotension: does the patient want the use of vasopressors if needed for   blood pressure? Yes    Respiratory emergency: does the patient want to have a trial of   non-invasive positive pressure ventilation (NIPPV/BiPAP)? Yes        12/05/18 1718            Patient Instructions Other Diet    Liquid diet x 3 days, soft diet x 3 days, then regular diet     If you have questions about your diet after you go home, you can call a dietitian at [phone].     Testing Not Required for Covid-19     Report These Signs and Symptoms    Please contact your doctor if you have any of the following symptoms: temperature higher than 100.4 degrees F, uncontrolled pain, persistent nausea and/or vomiting, severe abdominal pain or unable to have bowel movement     Questions About Your Stay    If you have an emergency after discharge, please dial [phone].    You may contact your discharging physician up to 7 days after discharge for questions about your hospitalization, discharge instructions, or medications by calling [phone] during regular business hours (8AM-4PM) and asking to speak to the doctor listed on the discharge information.       If you are not calling during business hours, ask for the on-call doctor.    If you have new  or worsening symptoms, you may be directed to your primary care provider (PCP) for ongoing questions, an Emergency Department, or an Urgent Care Clinic for a more immediate evaluation.    For all calls or questions more than 7 days after discharge, please contact your primary care provider (PCP).    For medications after discharge: pain (opioid) medicine cannot be refilled or prescribed by calling your discharging physician.  These medications need to be filled by your primary care provider (PCP).  Regular refill requests should be directed to your primary care provider (PCP).     Discharging attending physician: Reece Leader [[id number]]      Activity as Tolerated    It is important to keep increasing your activity level after you leave the hospital.  Moving around can help prevent blood clots, lung infection (pneumonia) and other problems.  Gradually increasing the number of times you are up moving around will help you return to your normal activity level more quickly.  Continue to increase the number of times you are up to the chair and walking daily to return to your normal activity level. Begin to work toward your normal activity level at discharge  Additional Orders: Case Management, Supplies, Home Health     Home Health/DME     None            Signed:  Reece Leader, MD  12/09/2018      cc:  Primary Care Physician:  Benjie Karvonen   Verified  Referring physicians:  Benjie Karvonen, MD   Additional provider(s):        Did we miss something? If additional records are needed, please fax a request on office letterhead to [phone]. Please include the patient's name, date of birth, fax number and type of information needed. Additional request can be made by email at [email or web address]. For general questions of information about electronic records sharing, call [phone].

## 2018-12-12 ENCOUNTER — Encounter: Admit: 2018-12-12 | Discharge: 2018-12-12

## 2018-12-12 DIAGNOSIS — K759 Inflammatory liver disease, unspecified: Secondary | ICD-10-CM

## 2018-12-12 DIAGNOSIS — K929 Disease of digestive system, unspecified: Secondary | ICD-10-CM

## 2018-12-12 DIAGNOSIS — R569 Unspecified convulsions: Secondary | ICD-10-CM

## 2018-12-12 DIAGNOSIS — K220 Achalasia of cardia: Secondary | ICD-10-CM

## 2018-12-12 DIAGNOSIS — G4700 Insomnia, unspecified: Secondary | ICD-10-CM

## 2018-12-12 NOTE — Telephone Encounter
-----   Message from Threasa Alpha, MD sent at 12/09/2018 11:28 AM CDT -----  Schedule telehealth follow up in 1 month  HRM in 3 months

## 2018-12-12 NOTE — Telephone Encounter
Order placed, My Charted the pt the instructions.

## 2018-12-12 NOTE — Telephone Encounter
Pts daughter called and LVM x2 that the pt has had dark diarrhea stools on Saturday and Sunday and now his stools are bright red.    Called and spoke with Pt's daughter. Pt is at his doctors office right now. Let her know that Dr. Candi Leash recommends they present to the ER at this time. Pt lives 2 hours away. They will go to a local ER. Pt's daughter has no further questions or concerns at this time. Gave the pt's daughter the direct number for additional questions or concerns.

## 2018-12-13 ENCOUNTER — Ambulatory Visit: Admit: 2018-12-13 | Discharge: 2018-12-13

## 2018-12-13 DIAGNOSIS — K220 Achalasia of cardia: Secondary | ICD-10-CM

## 2018-12-13 NOTE — Telephone Encounter
Called and spoke with the pt's daughter and then the pt. Pt was seen at [facility] in [place], [place]. CBC was fine per daughter. X-ray and CT completed, showed colitis. Had him stop taking his aspirin and sent him home. Pt says that he feels better today. Pt is now eating more solid foods. Has not had a bowel movement yet today. No further questions or concerns. Will call our office if any arise.

## 2019-01-05 ENCOUNTER — Encounter: Admit: 2019-01-05 | Discharge: 2019-01-05

## 2019-01-05 ENCOUNTER — Ambulatory Visit: Admit: 2019-01-05 | Discharge: 2019-01-05

## 2019-01-05 DIAGNOSIS — G4700 Insomnia, unspecified: Secondary | ICD-10-CM

## 2019-01-05 DIAGNOSIS — K759 Inflammatory liver disease, unspecified: Secondary | ICD-10-CM

## 2019-01-05 DIAGNOSIS — K929 Disease of digestive system, unspecified: Secondary | ICD-10-CM

## 2019-01-05 DIAGNOSIS — K220 Achalasia of cardia: Secondary | ICD-10-CM

## 2019-01-05 DIAGNOSIS — R569 Unspecified convulsions: Secondary | ICD-10-CM

## 2019-01-05 NOTE — Progress Notes
Telehealth Visit Note    Date of Service: 01/05/2019    Subjective:      Obtained patient's verbal consent to treat them and their agreement to [facility] financial policy and NPP via this telehealth visit during the [facility] Emergency       Evan Stevens is a 78 y.o. male.    History of Present Illness    This is a 78 year old Caucasian male with history of achalasia who underwent per oral endoscopic myotomy (P OEM) about a month ago.  He did well after that.  However about 2 weeks ago he started having some cough and phlegm and was diagnosed with pneumonia.  COVID testing was negative he says.  Symptoms have continued and then he was started on antibiotic.  Most recently yesterday he was started on steroids by the ER physician.  At present he feels okay as far as the pneumonia is concerned.  He denies any fever cough or shortness of breath.  As far as the symptoms of achalasia concerned they are much better.  He denies any dysphagia and is able to eat whatever he wants.  His weight is increased by 1 pound.  He denies any regurgitation or chest pain.  His Eckardt score is 0/12.  He was started on pantoprazole by his PCP on August 20 although he was not having any GERD or heartburn symptoms  I specifically asked him about any episodes of choking or waking up at night with cough and he denies.       Review of Systems   Constitutional: Positive for fatigue.   HENT: Positive for congestion.    Eyes: Negative.    Respiratory: Positive for cough and shortness of breath.    Cardiovascular: Negative.    Gastrointestinal: Negative.    Endocrine: Negative.    Genitourinary: Negative.    Musculoskeletal: Negative.    Skin: Negative.    Allergic/Immunologic: Negative.    Neurological: Positive for headaches.   Psychiatric/Behavioral: The patient is nervous/anxious.    Comprehensive 10 point ROS taken, and otherwise negative except as above.    Active Ambulatory Problems     Diagnosis Date Noted ??? Achalasia 12/05/2018   ??? Seizure disorder (HCC) 12/06/2018   ??? Anxiety and depression 12/06/2018   ??? Benign prostatic hyperplasia with lower urinary tract symptoms 12/06/2018   ??? Unintentional weight loss 12/06/2018   ??? Severe malnutrition (HCC) 12/06/2018     Resolved Ambulatory Problems     Diagnosis Date Noted   ??? No Resolved Ambulatory Problems     Past Medical History:   Diagnosis Date   ??? Gastrointestinal disorder    ??? Hepatitis    ??? Insomnia    ??? Seizures (HCC)        Social History     Socioeconomic History   ??? Marital status: Married     Spouse name: Not on file   ??? Number of children: Not on file   ??? Years of education: Not on file   ??? Highest education level: Not on file   Occupational History   ??? Not on file   Tobacco Use   ??? Smoking status: Never Smoker   ??? Smokeless tobacco: Former Estate agent and Sexual Activity   ??? Alcohol use: Yes   ??? Drug use: Never   ??? Sexual activity: Not on file   Other Topics Concern   ??? Not on file   Social History Narrative   ??? Not on file  History reviewed. No pertinent family history.    No Known Allergies    Patient Active Problem List    Diagnosis Date Noted   ??? Seizure disorder (HCC) 12/06/2018   ??? Anxiety and depression 12/06/2018   ??? Benign prostatic hyperplasia with lower urinary tract symptoms 12/06/2018   ??? Unintentional weight loss 12/06/2018   ??? Severe malnutrition (HCC) 12/06/2018     Class: Acute   ??? Achalasia 12/05/2018           Objective:         ??? ALPRAZolam (XANAX) 0.5 mg tablet Take 0.5 mg by mouth.   ??? aspirin EC 81 mg tablet Take 81 mg by mouth daily.   ??? calcium carbonate/vitamin D-3 (OSCAL-500+D) 1250 mg/200 unit tablet Take 1 tablet by mouth daily.   ??? famotidine (PEPCID) 20 mg tablet    ??? gabapentin (NEURONTIN) 300 mg capsule Take 300 mg by mouth.   ??? HYDROcodone/acetaminophen (NORCO) 5/325 mg tablet Take 1 tablet by mouth   ??? lamoTRIgine (LAMICTAL) 200 mg tablet Take 200 mg by mouth daily. ??? ondansetron (ZOFRAN ODT) 4 mg rapid dissolve tablet DISSOLVE 1 TABLET IN MOUTH EVERY 6 HOURS AS NEEDED FOR NAUSEA AND VOMITING   ??? Pantoprazole 40 mg grps    ??? sertraline (ZOLOFT) 50 mg tablet Take 50 mg by mouth daily.   ??? sucralfate (CARAFATE) 1 gram tablet Take 1 g by mouth three times daily before meals.   ??? tamsulosin (FLOMAX) 0.4 mg capsule Take 0.4 mg by mouth daily.     Vitals:    01/05/19 0750   Weight: 58.5 kg (129 lb)   Height: 180.3 cm (71)   PainSc: Zero     Body mass index is 17.99 kg/m???.     Telehealth Patient Reported Vitals     Row Name 01/05/19 0750                Pain Score  Zero              Physical Exam  Deferred as this was a telehealth visit       Assessment and Plan:  This is a 78 year old Caucasian male with history of achalasia who underwent per oral endoscopic myotomy (P OEM) about a month ago.  He did well after that.  However about 2 weeks ago he started having some cough and phlegm and was diagnosed with pneumonia.  COVID testing was negative he says.  Symptoms have continued and then he was started on antibiotic.  Most recently yesterday he was started on steroids by the ER physician.  At present he feels okay as far as the pneumonia is concerned.  He denies any fever cough or shortness of breath.  As far as the symptoms of achalasia concerned they are much better.  He denies any dysphagia and is able to eat whatever he wants.  His weight is increased by 1 pound.  He denies any regurgitation or chest pain.  His Eckardt score is 0/12.  He was started on pantoprazole by his PCP on August 20 although he was not having any GERD or heartburn symptoms  I specifically asked him about any episodes of choking or waking up at night with cough and he denies.    I discussed with the patient that he has responded very well to the per oral endoscopic myotomy.  We will repeat an esophageal manometry in 2 months.  I also reiterated that this is not a cure for achalasia.  He still has chew his food well and swallow one bite at a time.  He should some fluids to help push food down in case he feels it getting stuck in the retrosternal area.  He should sleep with head and elevated.  Advised him to continue the pantoprazole until he is being treated for the pneumonia.  Once this has been completed he can discontinue the pantoprazole.  If he has recurrence of heartburn symptoms then he can we start the PPI.  Follow-up in 1 year  Thank you for allowing [place] to participate in his care and please feel free to call us if you have any questions.                         25 minutes spent on this patient's encounter with counseling and coordination of care taking >50% of the visit.

## 2019-01-05 NOTE — Patient Instructions
If you have internet access/ smartphone please contact me through MyChart. This is our preferred method of contact and the most efficient way to contact your healthcare team. If you do not have internet access/smartphone you can call at [phone].

## 2019-02-07 ENCOUNTER — Encounter: Admit: 2019-02-07 | Discharge: 2019-02-07 | Payer: MEDICARE

## 2019-02-07 MED ORDER — LIDOCAINE HCL 2 % MM JELL
0 refills | Status: DC
Start: 2019-02-07 — End: 2019-02-07
  Administered 2019-02-07: 19:00:00 5 mL via TOPICAL

## 2019-07-11 ENCOUNTER — Encounter: Admit: 2019-07-11 | Discharge: 2019-07-11 | Payer: MEDICARE

## 2019-07-19 ENCOUNTER — Encounter: Admit: 2019-07-19 | Discharge: 2019-07-19 | Payer: MEDICARE

## 2019-12-01 IMAGING — DX Chest
2 series · 2 of 2 positions shown · non-contrast
Comparison: 12/30/2018 and CT chest 09/14/2018

Procedure(s): XR chest 2V

EXAM:  Two view chest x-ray.
REASON FOR EXAM: Chest pain, shortness of breath

[PA]
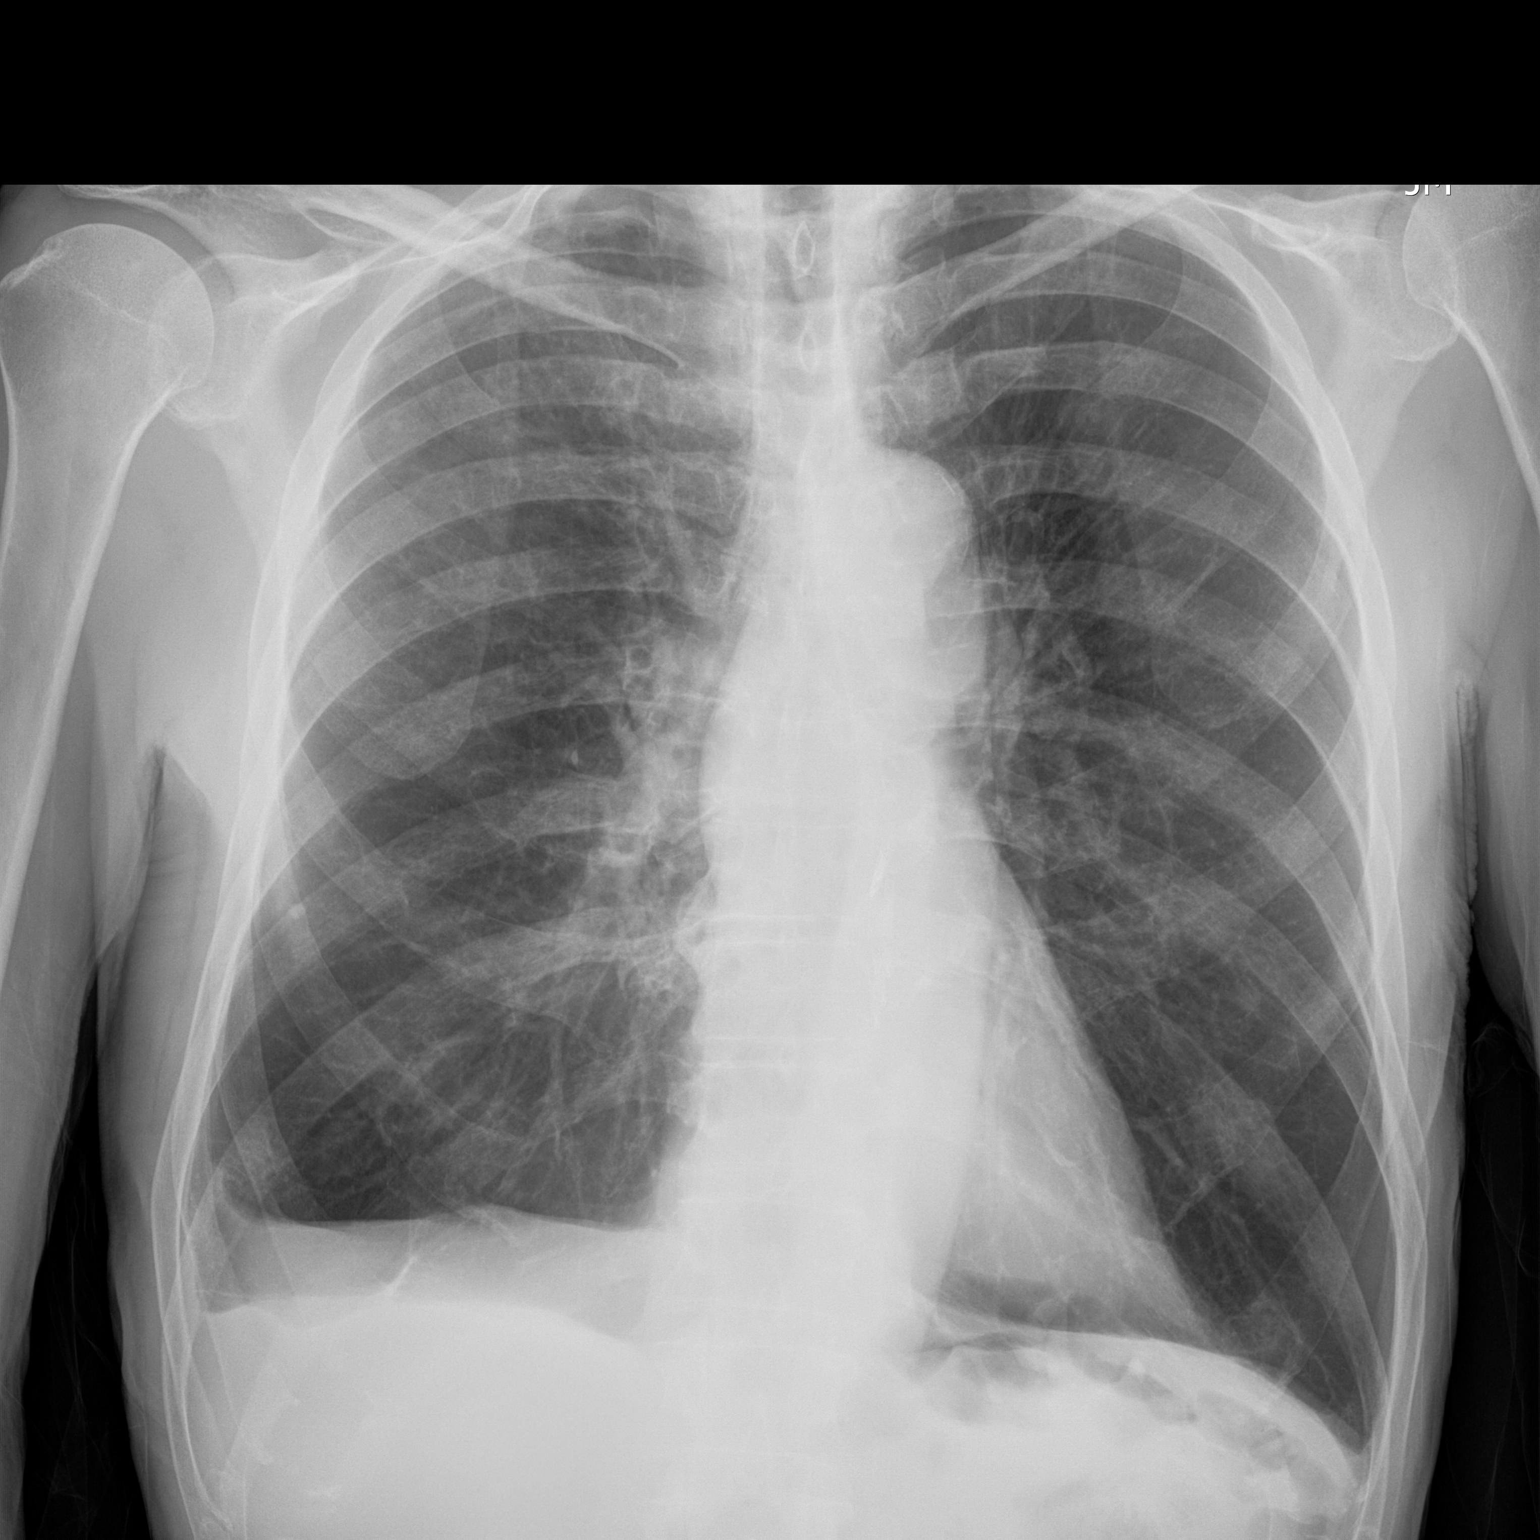

[left lateral]
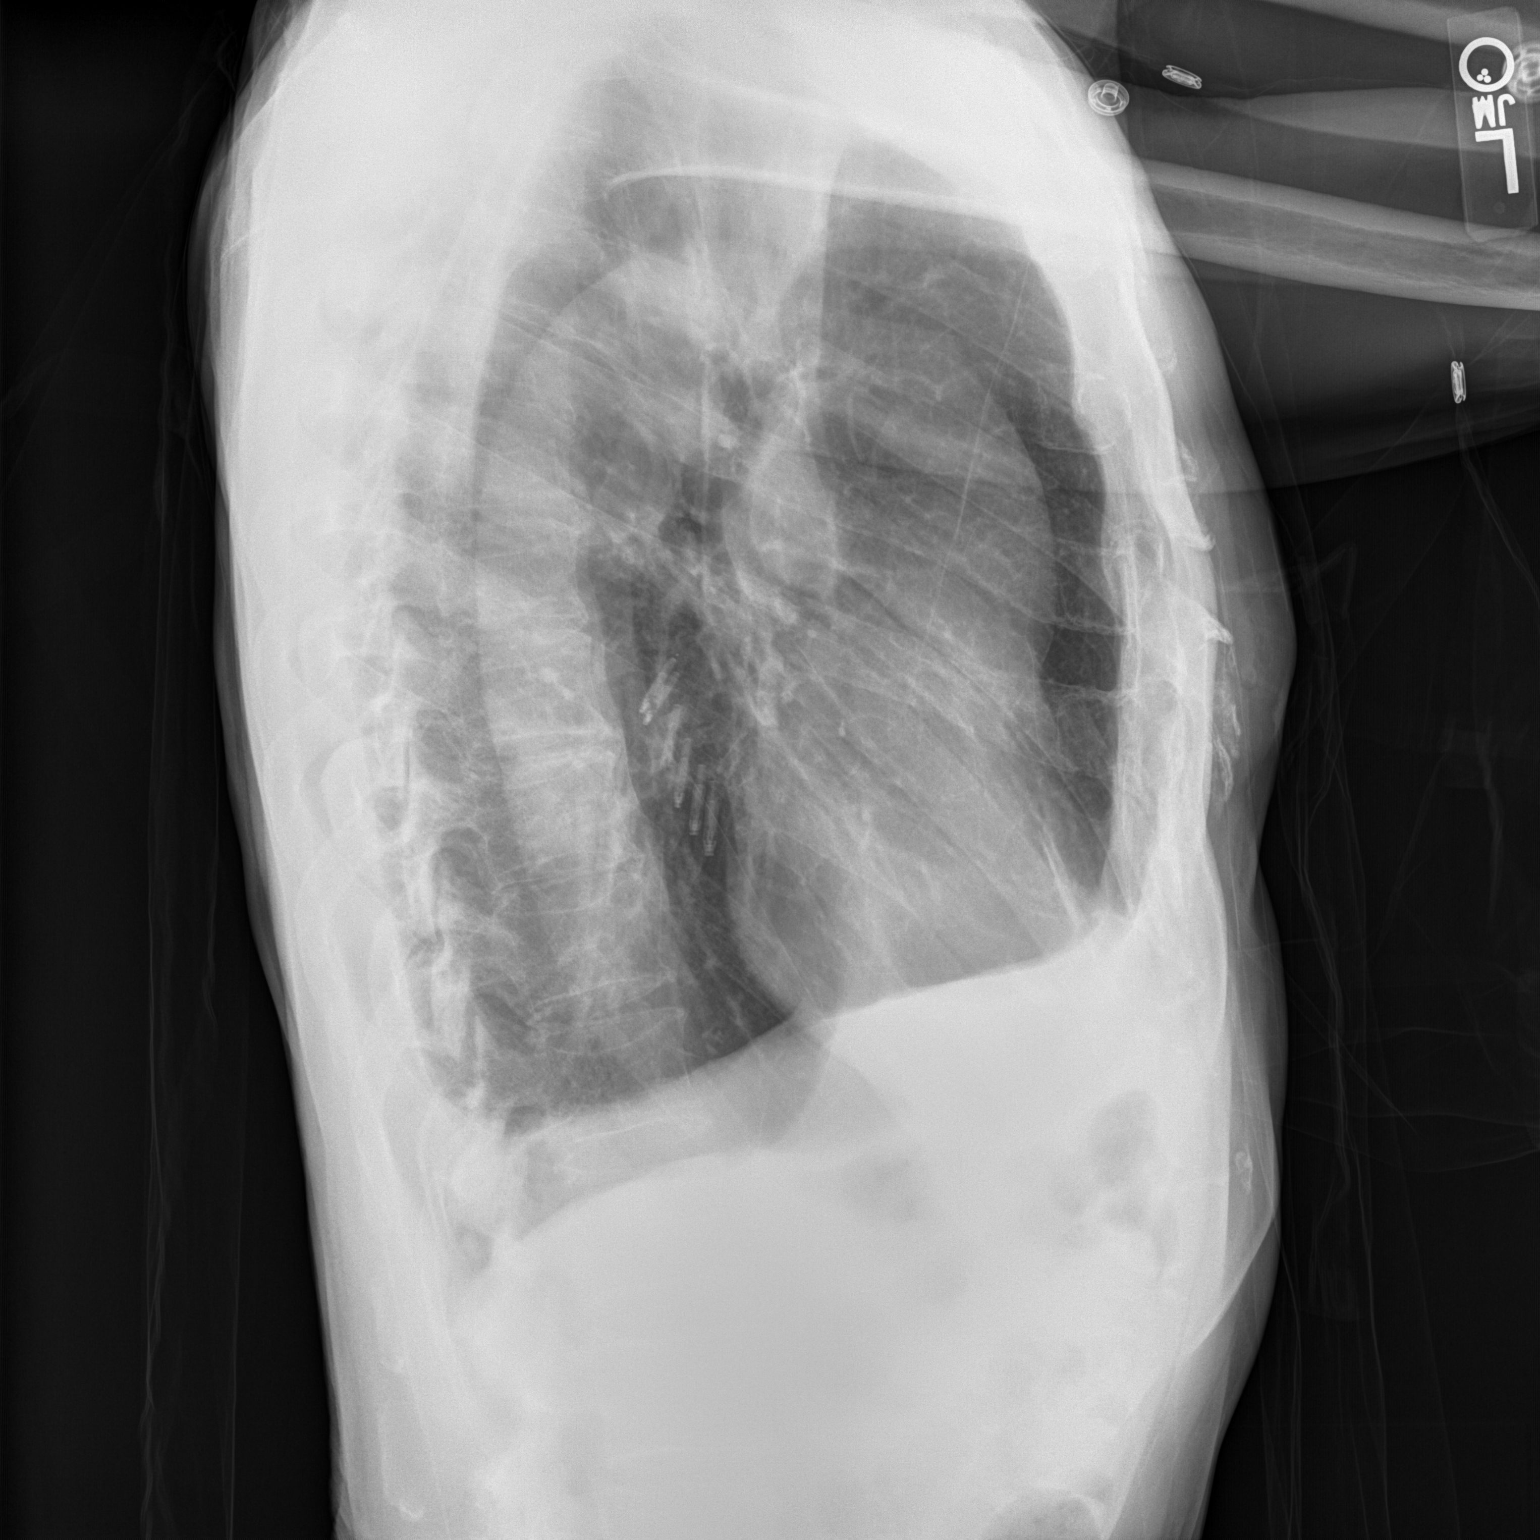

[2 of 2 positions shown; findings below may reference images not displayed]

FINDINGS: Two views. Stable blunting of the right costophrenic angle
secondary to chronic pleural thickening. The lungs are
hyperinflated. No focal consolidation. 3 mm calcified
granuloma within the peripheral mid to lower right lung,
unchanged. The cardiomediastinal contours are normal. The
osseous structures are unremarkable.
IMPRESSION: Stable chest. No acute process.

## 2020-01-09 ENCOUNTER — Encounter: Admit: 2020-01-09 | Discharge: 2020-01-09 | Payer: MEDICARE

## 2020-01-10 ENCOUNTER — Encounter: Admit: 2020-01-10 | Discharge: 2020-01-10 | Payer: MEDICARE

## 2020-01-10 ENCOUNTER — Ambulatory Visit: Admit: 2020-01-10 | Discharge: 2020-01-11 | Payer: MEDICARE

## 2020-01-10 DIAGNOSIS — K759 Inflammatory liver disease, unspecified: Secondary | ICD-10-CM

## 2020-01-10 DIAGNOSIS — G4700 Insomnia, unspecified: Secondary | ICD-10-CM

## 2020-01-10 DIAGNOSIS — K929 Disease of digestive system, unspecified: Secondary | ICD-10-CM

## 2020-01-10 DIAGNOSIS — R569 Unspecified convulsions: Secondary | ICD-10-CM

## 2020-01-10 NOTE — Patient Instructions
Recommendations:  Continue Dexilant for GERD  You can take Maalox or Tums on an as-needed basis for breakthrough GERD symptoms  Follow-up in 1 year      Please let me know if you have any questions or concerns.    Thank you,  Adelina Mings RN  Gastroenterology   [phone]

## 2021-01-16 ENCOUNTER — Encounter: Admit: 2021-01-16 | Discharge: 2021-01-16 | Payer: MEDICARE

## 2021-11-23 ENCOUNTER — Encounter: Admit: 2021-11-23 | Discharge: 2021-11-23 | Payer: MEDICARE

## 2022-03-18 ENCOUNTER — Encounter: Admit: 2022-03-18 | Discharge: 2022-03-18 | Payer: MEDICARE
